# Patient Record
Sex: Female | Born: 1980 | Hispanic: Yes | Marital: Married | Smoking: Never smoker
Health system: Southern US, Community
[De-identification: ages and names within clinical notes are randomized; demographics above are authoritative.]

## PROBLEM LIST (undated history)

## (undated) DIAGNOSIS — G44229 Chronic tension-type headache, not intractable: Secondary | ICD-10-CM

## (undated) DIAGNOSIS — K219 Gastro-esophageal reflux disease without esophagitis: Secondary | ICD-10-CM

## (undated) DIAGNOSIS — G43909 Migraine, unspecified, not intractable, without status migrainosus: Secondary | ICD-10-CM

## (undated) DIAGNOSIS — Z789 Other specified health status: Secondary | ICD-10-CM

## (undated) DIAGNOSIS — D649 Anemia, unspecified: Secondary | ICD-10-CM

## (undated) DIAGNOSIS — N83209 Unspecified ovarian cyst, unspecified side: Secondary | ICD-10-CM

## (undated) DIAGNOSIS — K8020 Calculus of gallbladder without cholecystitis without obstruction: Secondary | ICD-10-CM

## (undated) DIAGNOSIS — M7918 Myalgia, other site: Secondary | ICD-10-CM

## (undated) DIAGNOSIS — M47816 Spondylosis without myelopathy or radiculopathy, lumbar region: Secondary | ICD-10-CM

## (undated) HISTORY — DX: Migraine, unspecified, not intractable, without status migrainosus: G43.909

## (undated) HISTORY — DX: Unspecified ovarian cyst, unspecified side: N83.209

## (undated) HISTORY — PX: NO PAST SURGERIES: SHX2092

---

## 2012-07-31 ENCOUNTER — Emergency Department: Payer: Self-pay | Admitting: Internal Medicine

## 2014-02-10 ENCOUNTER — Ambulatory Visit: Payer: Self-pay | Admitting: Family Medicine

## 2014-09-06 ENCOUNTER — Ambulatory Visit: Payer: Self-pay

## 2014-10-11 NOTE — H&P (Signed)
GYN PRE-OP Visit  CC: dermoid cyst management  Subjective:    Erin Ramos is a 34 y.o. female who presents for surgical planning for dermoid cyst. TVUS done 09/2014 confirmed findings of dermoid cyst, although fibroid was not well seen on image.  Hx dermoid cyst: - diagnosed in [place] in 3.2016  - Patient was having AUB two months ago, daily bleeding - now resolved on ortho-tricyclin; This occurred in [place]. She was evaluated at a hospital there with a CTABP and TVUS. The TVUS showed a small fibroid and a 1.3cm hyperechoic mass c/w dermoid. Also a 70mm IM fibroid. CA125 done 71.   Also w/ pelvic pain during intercourse. Suprapubic pain dysparunia, patient is nervous about this, 5/10, stops after conclusion of sex.  No abnl discharge, no changes in bowel or bladder habits.    Gynecologic History No LMP recorded. 08/18/14 Contraception: OCP (estrogen/progesterone) Sexually active: yes Desires STD screening: no  Last Pap: last year. Results were: normal - At [facility]   Obstetric History                      OB History  Gravida Para Term Preterm AB SAB TAB Ectopic Multiple Living  2 2        2     # Outcome Date GA Lbr Len/2nd Weight Sex Delivery Anes PTL Lv  2 Para           1 Para              NSVD, 1999, 2000  Past Medical History:  has no past medical history on file. Problem List: has Dermoid cyst of right ovary on her problem list. Past Surgical History:  has no past surgical history on file. Family History: family history is not on file. Social History:  reports that she has never smoked. She has never used smokeless tobacco. She reports that she does not drink alcohol or use illicit drugs. +seatbelt, no DV Current Medications: has a current medication list which includes the following prescription(s): fluticasone and norgestimate-ethinyl estradiol. Prior to encounter Medications:        Current Outpatient Prescriptions on  File Prior to Visit  Medication Sig Dispense Refill  . fluticasone (FLONASE) 50 mcg/actuation nasal spray Place 1 spray into both nostrils once daily as needed.    . NORGESTIMATE-ETHINYL ESTRADIOL (ORTHO TRI-CYCLEN LO, 28, ORAL) Take 1 tablet by mouth once daily.     No current facility-administered medications on file prior to visit.    Allergies: is allergic to shellfish derived.  Review of Systems 14 systems reviewed pertinent positives and negatives as noted in the HPI and below.   Objective:        Visit Vitals  . BP 114/71  . Pulse 58  . Wt 77.7 kg (171 lb 3.2 oz)  . BMI 29.39 kg/m2    General appearance: alert, appears stated age and cooperative    Head: Normocephalic, without obvious abnormality, atraumatic Eyes: conjunctivae/corneas clear. PERRL, EOM's intact. Fundi benign. Sclera anicteric. Nose: no discharge Throat: lips, mucosa, and tongue normal; teeth and gums normal Neck: no adenopathy, supple, symmetrical, trachea midline and thyroid not enlarged, symmetric, no tenderness/mass/nodules Back: range of motion normal Lungs: clear to auscultation bilaterally Heart: regular rate and rhythm, S1, S2 normal, no murmur, click, rub or gallop Abdomen: soft, non-tender; bowel sounds normal; no masses, no organomegaly Extremities: extremities normal, atraumatic, no cyanosis or edema Pulses: 2+ and symmetric Skin: Skin color, texture, turgor normal. No  rashes or lesions Lymph nodes: Cervical, supraclavicular, and axillary nodes normal.  Pelvic: cervix normal in appearance, external genitalia normal, no cervical motion tenderness, uterus normal size, shape, and consistency and vagina normal without discharge; prominent anterior fibroid palpated superior to cervix on bimanual with TTP. Fullness in left adnexa with mild TTP.   TVUS 09/2014 Ut wnl No definite fibroid seen Lt ov wnl Rt simple ov cyst=1.31 cm Hyperechoic mass seen Post to rt ov= 1.36 x 1.11 x 1.05  cm  Imaging (reviewed by me): 07/05/14 - TVUS in [place]; posterior IM fibroid 30mm; EMS 105mm, hyperechoic avascular cyst in right ovary, 39mm c/w dermoid CTABP - normal   3/39/2015 - TVUS Uterus 8.8x3.1x4.8cm, superior uterine fibroid measuring 0.6x0.5x0.7cm EMS 2.58mm, Right ovary w/ small cyst 1.8x1.0x1.5cm, likely physiologic  Assessment:  34yo G2P2 w/ 1.3 cm hyperechoic mass on right ovary likely c/w dermoid cyst, also w/ intramural fibroid/mass causing dysparunia. We discussed surgical management with Lapx R ovarian cystectomy and possible myomectomy if fibroid visualized at time of surgery,   Plan:    1. Lapx ovarian cystectomy for 1.17mm Right hyperechoic ovarian cyst - R/B/A of surgery discussed  - The patient and I discussed the technical aspects of the procedure including the potential for risks and complications. These include but are not limited to the risk of infection requiring post-operative antibiotics or further procedures. We talked about the risk of injury to adjacent organs including bladder, bowel, ureter, blood vessels or nerves. We talked about the need to convert to an open incision. We talked about the possible need for blood transfusion. We talked aboutpostop complications such asthromboembolic or cardiopulmonary complications.We discussed possible transient menopausal symptoms after cystectomy. All of her questions were answered. The appropriate consents were signed. She is scheduled to undergo this procedure in the near future. - CA125 slightly elevated at 69, but patient premenopausal and this is consistent with diagnosis of dermoid cyst  2. Dysparunia and fibroid uterus - discussed possibility of myomectomy at same time as cystectomy, however the fibroid is not well visualized on TVUS. We discussed the r/b/a of myomectomy including increased bleeding and injury to uterus if the fibroid is not well visualized at time of lapx. We discussed removal if it is  subserosal and can be easily accessed.   3. HCM - uptodate with pap per patient .   Joylene Igo, MD

## 2014-10-16 ENCOUNTER — Encounter
Admission: RE | Admit: 2014-10-16 | Discharge: 2014-10-16 | Disposition: A | Discharge status: Home / Self Care | Payer: BC Managed Care – PPO | Source: Ambulatory Visit | Attending: Obstetrics and Gynecology | Admitting: Obstetrics and Gynecology

## 2014-10-16 DIAGNOSIS — Z01818 Encounter for other preprocedural examination: Secondary | ICD-10-CM | POA: Insufficient documentation

## 2014-10-16 DIAGNOSIS — D369 Benign neoplasm, unspecified site: Secondary | ICD-10-CM | POA: Insufficient documentation

## 2014-10-16 HISTORY — DX: Other specified health status: Z78.9

## 2014-10-16 LAB — CBC
HCT: 39.2 % (ref 35.0–47.0)
Hemoglobin: 13.4 g/dL (ref 12.0–16.0)
MCH: 31.3 pg (ref 26.0–34.0)
MCHC: 34.3 g/dL (ref 32.0–36.0)
MCV: 91.5 fL (ref 80.0–100.0)
PLATELETS: 241 10*3/uL (ref 150–440)
RBC: 4.28 MIL/uL (ref 3.80–5.20)
RDW: 13.1 % (ref 11.5–14.5)
WBC: 6.4 10*3/uL (ref 3.6–11.0)

## 2014-10-16 LAB — TYPE AND SCREEN
ABO/RH(D): B POS
Antibody Screen: NEGATIVE

## 2014-10-16 LAB — ABO/RH: ABO/RH(D): B POS

## 2014-10-17 LAB — BETA HCG QUANT (REF LAB)

## 2014-10-27 ENCOUNTER — Ambulatory Visit
Admission: RE | Admit: 2014-10-27 | Discharge: 2014-10-27 | Disposition: A | Discharge status: Home / Self Care | Payer: BC Managed Care – PPO | Source: Ambulatory Visit | Attending: Obstetrics and Gynecology | Admitting: Obstetrics and Gynecology

## 2014-10-27 ENCOUNTER — Encounter
Admission: RE | Disposition: A | Discharge status: Home / Self Care | Payer: Self-pay | Source: Ambulatory Visit | Attending: Obstetrics and Gynecology

## 2014-10-27 ENCOUNTER — Encounter: Payer: Self-pay | Admitting: *Deleted

## 2014-10-27 ENCOUNTER — Ambulatory Visit: Payer: BC Managed Care – PPO | Admitting: Anesthesiology

## 2014-10-27 DIAGNOSIS — R102 Pelvic and perineal pain: Secondary | ICD-10-CM | POA: Diagnosis not present

## 2014-10-27 DIAGNOSIS — N838 Other noninflammatory disorders of ovary, fallopian tube and broad ligament: Secondary | ICD-10-CM | POA: Diagnosis not present

## 2014-10-27 DIAGNOSIS — N938 Other specified abnormal uterine and vaginal bleeding: Secondary | ICD-10-CM | POA: Insufficient documentation

## 2014-10-27 DIAGNOSIS — N941 Unspecified dyspareunia: Secondary | ICD-10-CM | POA: Diagnosis not present

## 2014-10-27 DIAGNOSIS — N803 Endometriosis of pelvic peritoneum: Secondary | ICD-10-CM | POA: Diagnosis not present

## 2014-10-27 DIAGNOSIS — N809 Endometriosis, unspecified: Secondary | ICD-10-CM | POA: Diagnosis present

## 2014-10-27 DIAGNOSIS — Z91013 Allergy to seafood: Secondary | ICD-10-CM | POA: Insufficient documentation

## 2014-10-27 HISTORY — PX: LAPAROSCOPIC OVARIAN CYSTECTOMY: SHX6248

## 2014-10-27 LAB — POCT PREGNANCY, URINE: PREG TEST UR: NEGATIVE

## 2014-10-27 SURGERY — EXCISION, CYST, OVARY, LAPAROSCOPIC
Anesthesia: General | Laterality: Right

## 2014-10-27 MED ORDER — FENTANYL CITRATE (PF) 100 MCG/2ML IJ SOLN
INTRAMUSCULAR | Status: AC
  Administered 2014-10-27: 25 ug via INTRAVENOUS
  Filled 2014-10-27: qty 2

## 2014-10-27 MED ORDER — ONDANSETRON HCL 4 MG/2ML IJ SOLN: 4.0000 mg | Freq: Four times a day (QID) | INTRAMUSCULAR | Status: DC | PRN

## 2014-10-27 MED ORDER — BUPIVACAINE HCL 0.5 % IJ SOLN
INTRAMUSCULAR | Status: DC | PRN
  Administered 2014-10-27: 21 mL

## 2014-10-27 MED ORDER — KETOROLAC TROMETHAMINE 30 MG/ML IJ SOLN
INTRAMUSCULAR | Status: DC | PRN
  Administered 2014-10-27: 30 mg via INTRAVENOUS

## 2014-10-27 MED ORDER — OXYCODONE-ACETAMINOPHEN 5-325 MG PO TABS
ORAL_TABLET | ORAL | Status: AC
  Filled 2014-10-27: qty 2

## 2014-10-27 MED ORDER — ONDANSETRON HCL 4 MG/2ML IJ SOLN: 4.0000 mg | Freq: Once | INTRAMUSCULAR | Status: DC | PRN

## 2014-10-27 MED ORDER — KETOROLAC TROMETHAMINE 15 MG/ML IJ SOLN
15.0000 mg | Freq: Four times a day (QID) | INTRAMUSCULAR | Status: DC | PRN
Start: 2014-10-27 — End: 2014-10-27

## 2014-10-27 MED ORDER — ONDANSETRON 4 MG PO TBDP: 4.0000 mg | ORAL_TABLET | Freq: Four times a day (QID) | ORAL | Status: DC | PRN

## 2014-10-27 MED ORDER — LACTATED RINGERS IV SOLN: INTRAVENOUS | Status: DC

## 2014-10-27 MED ORDER — OXYCODONE-ACETAMINOPHEN 5-325 MG PO TABS
2.0000 | ORAL_TABLET | Freq: Once | ORAL | Status: AC
  Administered 2014-10-27: 2 via ORAL

## 2014-10-27 MED ORDER — GLYCOPYRROLATE 0.2 MG/ML IJ SOLN
INTRAMUSCULAR | Status: DC | PRN
  Administered 2014-10-27: 0.6 mg via INTRAVENOUS

## 2014-10-27 MED ORDER — NEOSTIGMINE METHYLSULFATE 10 MG/10ML IV SOLN
INTRAVENOUS | Status: DC | PRN
  Administered 2014-10-27: 5 mg via INTRAVENOUS

## 2014-10-27 MED ORDER — PHENYLEPHRINE HCL 10 MG/ML IJ SOLN
INTRAMUSCULAR | Status: DC | PRN
  Administered 2014-10-27: 100 ug via INTRAVENOUS

## 2014-10-27 MED ORDER — BUPIVACAINE HCL (PF) 0.5 % IJ SOLN
INTRAMUSCULAR | Status: AC
  Filled 2014-10-27: qty 30

## 2014-10-27 MED ORDER — FENTANYL CITRATE (PF) 100 MCG/2ML IJ SOLN
25.0000 ug | INTRAMUSCULAR | Status: AC | PRN
  Administered 2014-10-27 (×6): 25 ug via INTRAVENOUS

## 2014-10-27 MED ORDER — ACETAMINOPHEN 650 MG RE SUPP: 650.0000 mg | Freq: Four times a day (QID) | RECTAL | Status: DC | PRN

## 2014-10-27 MED ORDER — PROPOFOL 10 MG/ML IV BOLUS
INTRAVENOUS | Status: DC | PRN
  Administered 2014-10-27: 150 mg via INTRAVENOUS

## 2014-10-27 MED ORDER — DIPHENHYDRAMINE HCL 12.5 MG/5ML PO ELIX: 12.5000 mg | ORAL_SOLUTION | Freq: Four times a day (QID) | ORAL | Status: DC | PRN

## 2014-10-27 MED ORDER — FENTANYL CITRATE (PF) 100 MCG/2ML IJ SOLN
INTRAMUSCULAR | Status: DC | PRN
  Administered 2014-10-27 (×2): 100 ug via INTRAVENOUS

## 2014-10-27 MED ORDER — DEXAMETHASONE SODIUM PHOSPHATE 4 MG/ML IJ SOLN
INTRAMUSCULAR | Status: DC | PRN
  Administered 2014-10-27: 10 mg via INTRAVENOUS

## 2014-10-27 MED ORDER — LIDOCAINE HCL (CARDIAC) 20 MG/ML IV SOLN
INTRAVENOUS | Status: DC | PRN
  Administered 2014-10-27: 80 mg via INTRAVENOUS

## 2014-10-27 MED ORDER — ACETAMINOPHEN 325 MG PO TABS: 650.0000 mg | ORAL_TABLET | Freq: Four times a day (QID) | ORAL | Status: DC | PRN

## 2014-10-27 MED ORDER — DIPHENHYDRAMINE HCL 50 MG/ML IJ SOLN: 12.5000 mg | Freq: Four times a day (QID) | INTRAMUSCULAR | Status: DC | PRN

## 2014-10-27 MED ORDER — LACTATED RINGERS IV SOLN
INTRAVENOUS | Status: DC
  Administered 2014-10-27 (×3): via INTRAVENOUS

## 2014-10-27 MED ORDER — FAMOTIDINE 20 MG PO TABS
20.0000 mg | ORAL_TABLET | Freq: Once | ORAL | Status: AC
  Administered 2014-10-27: 20 mg via ORAL

## 2014-10-27 MED ORDER — ONDANSETRON HCL 4 MG/2ML IJ SOLN
INTRAMUSCULAR | Status: DC | PRN
  Administered 2014-10-27: 4 mg via INTRAVENOUS

## 2014-10-27 MED ORDER — METHYLENE BLUE 1 % INJ SOLN
INTRAMUSCULAR | Status: AC
  Filled 2014-10-27: qty 10

## 2014-10-27 MED ORDER — OXYCODONE HCL 5 MG PO TABS: 5.0000 mg | ORAL_TABLET | ORAL | Status: DC | PRN

## 2014-10-27 MED ORDER — KETOROLAC TROMETHAMINE 15 MG/ML IJ SOLN: 15.0000 mg | Freq: Four times a day (QID) | INTRAMUSCULAR | Status: DC

## 2014-10-27 MED ORDER — OXYCODONE-ACETAMINOPHEN 5-325 MG PO TABS: 2.0000 | ORAL_TABLET | ORAL | Status: DC | PRN

## 2014-10-27 MED ORDER — MIDAZOLAM HCL 2 MG/2ML IJ SOLN
INTRAMUSCULAR | Status: DC | PRN
  Administered 2014-10-27: 2 mg via INTRAVENOUS

## 2014-10-27 MED ORDER — ROCURONIUM BROMIDE 100 MG/10ML IV SOLN
INTRAVENOUS | Status: DC | PRN
  Administered 2014-10-27: 30 mg via INTRAVENOUS
  Administered 2014-10-27: 10 mg via INTRAVENOUS

## 2014-10-27 SURGICAL SUPPLY — 36 items
BAG URO DRAIN 2000ML W/SPOUT (MISCELLANEOUS) ×4 IMPLANT
BLADE SURG SZ11 CARB STEEL (BLADE) ×4 IMPLANT
CATH FOLEY 2WAY  5CC 16FR (CATHETERS) ×2
CATH ROBINSON RED A/P 16FR (CATHETERS) ×4 IMPLANT
CATH URTH 16FR FL 2W BLN LF (CATHETERS) ×2 IMPLANT
CHLORAPREP W/TINT 26ML (MISCELLANEOUS) ×4 IMPLANT
CLOSURE WOUND 1/4X4 (GAUZE/BANDAGES/DRESSINGS) ×2
DRSG TEGADERM 2-3/8X2-3/4 SM (GAUZE/BANDAGES/DRESSINGS) ×8 IMPLANT
ENDOPOUCH RETRIEVER 10 (MISCELLANEOUS) IMPLANT
GAUZE SPONGE NON-WVN 2X2 STRL (MISCELLANEOUS) ×2 IMPLANT
GLOVE BIO SURGEON STRL SZ 6.5 (GLOVE) ×12 IMPLANT
GLOVE BIO SURGEONS STRL SZ 6.5 (GLOVE) ×4
GLOVE INDICATOR 7.0 STRL GRN (GLOVE) ×8 IMPLANT
GOWN STRL REUS W/ TWL LRG LVL3 (GOWN DISPOSABLE) ×4 IMPLANT
GOWN STRL REUS W/TWL LRG LVL3 (GOWN DISPOSABLE) ×4
IRRIGATION STRYKERFLOW (MISCELLANEOUS) ×2 IMPLANT
IRRIGATOR STRYKERFLOW (MISCELLANEOUS) ×4
KIT RM TURNOVER CYSTO AR (KITS) ×4 IMPLANT
LABEL OR SOLS (LABEL) ×4 IMPLANT
LIGASURE BLUNT 5MM 37CM (INSTRUMENTS) ×4 IMPLANT
NS IRRIG 500ML POUR BTL (IV SOLUTION) ×4 IMPLANT
PACK GYN LAPAROSCOPIC (MISCELLANEOUS) ×4 IMPLANT
PAD OB MATERNITY 4.3X12.25 (PERSONAL CARE ITEMS) ×4 IMPLANT
PAD PREP 24X41 OB/GYN DISP (PERSONAL CARE ITEMS) ×4 IMPLANT
SHEARS HARMONIC ACE PLUS 36CM (ENDOMECHANICALS) IMPLANT
SLEEVE ENDOPATH XCEL 5M (ENDOMECHANICALS) ×4 IMPLANT
SPONGE VERSALON 2X2 STRL (MISCELLANEOUS) ×2
STRIP CLOSURE SKIN 1/4X4 (GAUZE/BANDAGES/DRESSINGS) ×6 IMPLANT
SUT VIC AB 2-0 UR6 27 (SUTURE) IMPLANT
SUT VIC AB 4-0 SH 27 (SUTURE)
SUT VIC AB 4-0 SH 27XANBCTRL (SUTURE) IMPLANT
SWABSTK COMLB BENZOIN TINCTURE (MISCELLANEOUS) ×4 IMPLANT
TROCAR ENDO BLADELESS 11MM (ENDOMECHANICALS) IMPLANT
TROCAR XCEL NON-BLD 5MMX100MML (ENDOMECHANICALS) ×4 IMPLANT
TROCAR XCEL UNIV SLVE 11M 100M (ENDOMECHANICALS) ×4 IMPLANT
TUBING INSUFFLATOR HI FLOW (MISCELLANEOUS) ×4 IMPLANT

## 2014-10-27 NOTE — Anesthesia Postprocedure Evaluation (Signed)
  Anesthesia Post-op Note  Patient: Erin Ramos  Procedure(s) Performed: Procedure(s): LAPAROSCOPIC OVARIAN CYSTECTOMY, peritoneal biopsy (Right)  Anesthesia type:General  Patient location: PACU  Post pain: Pain level controlled  Post assessment: Post-op Vital signs reviewed, Patient's Cardiovascular Status Stable, Respiratory Function Stable, Patent Airway and No signs of Nausea or vomiting  Post vital signs: Reviewed and stable  Last Vitals:  Filed Vitals:   10/27/14 1200  BP: 128/64  Pulse:   Temp:   Resp: 18    Level of consciousness: awake, alert  and patient cooperative  Complications: No apparent anesthesia complications

## 2014-10-27 NOTE — Anesthesia Preprocedure Evaluation (Addendum)
Anesthesia Evaluation  Patient identified by MRN, date of birth, ID band Patient awake    Reviewed: Allergy & Precautions, NPO status , Patient's Chart, lab work & pertinent test results  Airway Mallampati: II  TM Distance: >3 FB Neck ROM: Full    Dental no notable dental hx.    Pulmonary neg pulmonary ROS,    Pulmonary exam normal breath sounds clear to auscultation       Cardiovascular negative cardio ROS Normal cardiovascular exam     Neuro/Psych negative neurological ROS  negative psych ROS   GI/Hepatic negative GI ROS, Neg liver ROS,   Endo/Other  negative endocrine ROS  Renal/GU negative Renal ROS     Musculoskeletal negative musculoskeletal ROS (+)   Abdominal Normal abdominal exam  (+)   Peds negative pediatric ROS (+)  Hematology negative hematology ROS (+)   Anesthesia Other Findings   Reproductive/Obstetrics negative OB ROS                            Anesthesia Physical Anesthesia Plan  ASA: I  Anesthesia Plan: General   Post-op Pain Management:    Induction: Intravenous  Airway Management Planned: Oral ETT  Additional Equipment:   Intra-op Plan:   Post-operative Plan: Extubation in OR  Informed Consent: I have reviewed the patients History and Physical, chart, labs and discussed the procedure including the risks, benefits and alternatives for the proposed anesthesia with the patient or authorized representative who has indicated his/her understanding and acceptance.   Dental advisory given  Plan Discussed with: CRNA and Surgeon  Anesthesia Plan Comments:         Anesthesia Quick Evaluation

## 2014-10-27 NOTE — Anesthesia Procedure Notes (Signed)
Procedure Name: Intubation Date/Time: 10/27/2014 7:54 AM Performed by: Leander Rams Pre-anesthesia Checklist: Patient identified, Emergency Drugs available, Suction available, Patient being monitored and Timeout performed Patient Re-evaluated:Patient Re-evaluated prior to inductionOxygen Delivery Method: Circle system utilized Preoxygenation: Pre-oxygenation with 100% oxygen Intubation Type: IV induction Ventilation: Mask ventilation without difficulty Laryngoscope Size: 3 Grade View: Grade I Tube type: Oral Tube size: 7.0 mm Number of attempts: 1 Airway Equipment and Method: Stylet Placement Confirmation: ETT inserted through vocal cords under direct vision Secured at: 21 cm Tube secured with: Tape Dental Injury: Teeth and Oropharynx as per pre-operative assessment

## 2014-10-27 NOTE — Interval H&P Note (Signed)
History and Physical Interval Note:  10/27/2014 7:15 AM  Erin Ramos  has presented today for surgery, with the diagnosis of DERMOID CYST,FIBROID  The various methods of treatment have been discussed with the patient and family. After consideration of risks, benefits and other options for treatment, the patient has consented to  Procedure(s): LAPAROSCOPIC OVARIAN CYSTECTOMY (Right) MYOMECTOMY (N/A) as a surgical intervention .  The patient's history has been reviewed, patient examined, no change in status, stable for surgery.  I have reviewed the patient's chart and labs.  Questions were answered to the patient's satisfaction.     [place]

## 2014-10-27 NOTE — Discharge Instructions (Signed)
Diagnostic Laparoscopy A diagnostic laparoscopy is a procedure to diagnose diseases in the abdomen. During the procedure, a thin, lighted, pencil-sized instrument called a laparoscope is inserted into the abdomen through an incision. The laparoscope allows your health care provider to look at the organs inside your body. LET [facility] CARE PROVIDER KNOW ABOUT:  Any allergies you have.  All medicines you are taking, including vitamins, herbs, eye drops, creams, and over-the-counter medicines.  Previous problems you or members of your family have had with the use of anesthetics.  Any blood disorders you have.  Previous surgeries you have had.  Medical conditions you have. RISKS AND COMPLICATIONS  Generally, this is a safe procedure. However, problems can occur, which may include:  Infection.  Bleeding.  Damage to other organs.  Allergic reaction to the anesthetics used during the procedure. AFTER THE PROCEDURE  Your blood pressure, heart rate, breathing rate, and blood oxygen level will be monitored often until the medicines you were given have worn off.   This information is not intended to replace advice given to you by your health care provider. Make sure you discuss any questions you have with your health care provider.   Document Released: 04/07/2000 Document Revised: 09/20/2014 Document Reviewed: 08/12/2013 Elsevier Interactive Patient Education 2016 [place] Anesthesia, Adult, Care After Refer to this sheet in the next few weeks. These instructions provide you with information on caring for yourself after your procedure. Your health care provider may also give you more specific instructions. Your treatment has been planned according to current medical practices, but problems sometimes occur. Call your health care provider if you have any problems or questions after your procedure. WHAT TO EXPECT AFTER THE PROCEDURE After the procedure, it is typical to  experience:  Sleepiness.  Nausea and vomiting. HOME CARE INSTRUCTIONS  For the first 24 hours after general anesthesia:  Have a responsible person with you.  Do not drive a car. If you are alone, do not take public transportation.  Do not drink alcohol.  Do not take medicine that has not been prescribed by your health care provider.  Do not sign important papers or make important decisions.  You may resume a normal diet and activities as directed by your health care provider.  Change bandages (dressings) as directed.  If you have questions or problems that seem related to general anesthesia, call the hospital and ask for the anesthetist or anesthesiologist on call. SEEK MEDICAL CARE IF:  You have nausea and vomiting that continue the day after anesthesia.  You develop a rash. SEEK IMMEDIATE MEDICAL CARE IF:   You have difficulty breathing.  You have chest pain.  You have any allergic problems.   This information is not intended to replace advice given to you by your health care provider. Make sure you discuss any questions you have with your health care provider.   Document Released: 04/07/2000 Document Revised: 01/20/2014 Document Reviewed: 04/30/2011 Elsevier Interactive Patient Education [employer].

## 2014-10-27 NOTE — Transfer of Care (Signed)
Immediate Anesthesia Transfer of Care Note  Patient: Erin Ramos  Procedure(s) Performed: Procedure(s): LAPAROSCOPIC OVARIAN CYSTECTOMY, peritoneal biopsy (Right)  Patient Location: PACU  Anesthesia Type:General  Level of Consciousness: awake  Airway & Oxygen Therapy: Patient Spontanous Breathing  Post-op Assessment: Report given to RN  Post vital signs: stable  Last Vitals:  Filed Vitals:   10/27/14 1003  BP: 115/77  Pulse: 75  Temp: 37.2 C  Resp: 15    Complications: No apparent anesthesia complications

## 2014-10-27 NOTE — Op Note (Signed)
Laparoscopic Ovarian Cystectomy, peritoneal biopsy Procedure Note  Indications: 1cm Right ovarian cyst, dysparunia  Pre-operative Diagnosis: Dermoid cyst, small fibroid  Post-operative Diagnosis: Endometriosis, paratubal cyst  Surgeon: Lorette Ang , MD  Assistants: Benjaman Kindler, MD  Anesthesia: General endotracheal anesthesia  Procedure Details  The patient was seen in the Holding Room. The risks, benefits, complications, treatment options, and expected outcomes were discussed with the patient. The possibilities of reaction to medication, pulmonary aspiration, perforation of viscus, bleeding, recurrent infection, the need for additional procedures, failure to diagnose a condition, and creating a complication requiring transfusion or operation were discussed with the patient. The patient concurred with the proposed plan, giving informed consent. The patient was taken to the Operating Room, identified as Erin Ramos and the procedure verified as Laparoscopic right ovarian cystectomy, possible myomectomy. A Time Out was held and the above information confirmed.  After induction of general anesthesia, the patient was placed in modified dorsal lithotomy position where she was prepped, draped, and catheterized in the normal, sterile fashion. A bivalved speculum was then placed in the patient's vagina, and the anterior lip of cervix grasped with the single-tooth tenaculum.  The ZUMI uterine manipulator was then advanced into the uterus.  The speculum was removed from the vagina.  1cc of 0.5% marcaine was injected at the inferior aspect of the umbilicus. A 5 mm umbilical incision was then performed. Under direct visualization, the optiview 14mm trochar was inserted into the abdomen. Opening pressure 22mmHG. Pneumoperitoneum was established. A second 19mm incision was made and a 58mm trochar was inserted 2cm superior and medial to the right ASIS. A 73mm incision was made 2cm superior and medial to  the left ASIS and a 63mm trochar was inserted. The findings were noted as described below: 3 10mm chocolate "burn" like implants on the posterior cul de sac, the left uterosacral ligament, and the right pelvic sidewall; normal uterus without evidence of subserosal fibroids, normal ovaries, and a 1cm right paratubal cyst.  A survey of the abdomen did not reveal any obvious findings.   Using a biopsy forceps, the three presumed endometrial implants were removed and sent to pathology. Bipolar shears were used to remove the paratubal cyst from the right fallopian tube. This was also sent to pathology. The right ovary did not have an obvious cyst. After careful dissection of the capsule, no sebacous material was encountered. We attempted multiple passes with the needle forceps into the ovary, but no cyst was encountered. Hemostasis was achieved with bipolar cautery. 0.5% marcaine was drizzled over the right ovary. All sites were hemostatic. Suction irrigation was used in the pelvis. The 63mm trochar was removed and the fascia closed with the cone and 0-vicryl. After all intra-abdominal carbon dioxide was expressed, all instruments were removed. The skin was closed with 4-0 vicryl. The lower quadrant incisions were covered with benzoine and steristrips. A tegrederm dressing with telfa was placed on top of all three incisions.   Instrument, sponge, and needle counts were correct prior to abdominal closure and at the conclusion of the case. IV toradol was given.   Findings: The anterior cul-de-sac and round ligaments: normal The uterus normal without evidence of fibroids The adnexa both appeared normal; Tube appeared normal Cul-de-sac with evidence of 3 "powder burn" lesions - left uterosacral, right pelvic wall 1cm right paraovarian cyst  Estimated Blood Loss:  less than 50 mL         Drains: none  Total IV Fluids: 1281mL         Specimens: peritoneal biopsies, right paraovarian cyst            Complications:  None; patient tolerated the procedure well.         Disposition: PACU - hemodynamically stable.         Condition: stable  Lorette Ang, MD

## 2014-10-30 LAB — SURGICAL PATHOLOGY

## 2014-11-02 ENCOUNTER — Ambulatory Visit
Admission: RE | Admit: 2014-11-02 | Discharge: 2014-11-02 | Disposition: A | Discharge status: Home / Self Care | Payer: BC Managed Care – PPO | Source: Ambulatory Visit | Attending: Obstetrics and Gynecology | Admitting: Obstetrics and Gynecology

## 2014-11-02 ENCOUNTER — Other Ambulatory Visit: Payer: Self-pay | Admitting: Obstetrics and Gynecology

## 2014-11-02 DIAGNOSIS — R1032 Left lower quadrant pain: Secondary | ICD-10-CM

## 2014-11-02 DIAGNOSIS — R109 Unspecified abdominal pain: Secondary | ICD-10-CM | POA: Diagnosis present

## 2014-11-02 DIAGNOSIS — L7682 Other postprocedural complications of skin and subcutaneous tissue: Secondary | ICD-10-CM

## 2014-11-02 DIAGNOSIS — R208 Other disturbances of skin sensation: Secondary | ICD-10-CM | POA: Diagnosis not present

## 2014-11-19 ENCOUNTER — Emergency Department
Admission: EM | Admit: 2014-11-19 | Discharge: 2014-11-19 | Disposition: A | Discharge status: Home / Self Care | Payer: BC Managed Care – PPO

## 2015-08-15 ENCOUNTER — Other Ambulatory Visit: Payer: Self-pay | Admitting: Family Medicine

## 2015-08-15 DIAGNOSIS — N809 Endometriosis, unspecified: Secondary | ICD-10-CM

## 2015-10-08 ENCOUNTER — Ambulatory Visit: Payer: BC Managed Care – PPO

## 2016-03-17 ENCOUNTER — Ambulatory Visit: Payer: BC Managed Care – PPO

## 2017-08-06 ENCOUNTER — Other Ambulatory Visit: Payer: Self-pay

## 2017-08-06 ENCOUNTER — Ambulatory Visit: Payer: BC Managed Care – PPO | Admitting: Gastroenterology

## 2017-08-06 VITALS — BP 109/75 | HR 74 | Ht 64.0 in | Wt 183.8 lb

## 2017-08-06 DIAGNOSIS — A048 Other specified bacterial intestinal infections: Secondary | ICD-10-CM | POA: Diagnosis not present

## 2017-08-06 DIAGNOSIS — D270 Benign neoplasm of right ovary: Secondary | ICD-10-CM | POA: Insufficient documentation

## 2017-08-06 MED ORDER — TETRACYCLINE HCL 500 MG PO CAPS: 500.0000 mg | ORAL_CAPSULE | Freq: Four times a day (QID) | ORAL | 0 refills | Status: AC

## 2017-08-06 MED ORDER — BISMUTH SUBSALICYLATE 262 MG PO CHEW: 524.0000 mg | CHEWABLE_TABLET | Freq: Four times a day (QID) | ORAL | 0 refills | Status: AC

## 2017-08-06 MED ORDER — METRONIDAZOLE 500 MG PO TABS: 500.0000 mg | ORAL_TABLET | Freq: Four times a day (QID) | ORAL | 0 refills | Status: AC

## 2017-08-06 MED ORDER — OMEPRAZOLE 40 MG PO CPDR: 40.0000 mg | DELAYED_RELEASE_CAPSULE | Freq: Two times a day (BID) | ORAL | 0 refills | Status: AC

## 2017-08-06 NOTE — Progress Notes (Signed)
Jonathon Bellows MD, MRCP(U.K) [address]  [place]  [place], [place] [address]  Main: [phone]  Fax: [phone]   Gastroenterology Consultation  Referring Provider:     [name], MD Primary Care Physician:  [name], MD Primary Gastroenterologist:  Dr. Jonathon Bellows  Reason for Consultation:     H pylori         HPI:   Erin Ramos is a 37 y.o. y/o female referred for consultation & management  by Dr. [name], MD.    She has been referred by Dr Posey Pronto for H pylori stool test that was positive in 06/2017 .   She says it was checked for abdominal pain. She is accompanied by hyer husband.    Abdominal pain: Onset: says she has had abdominal pain for a year and was treated for H pylori last year.  Site :epigastric , all the time  Radiation: localized  Severity :carafate helps otherwise 8/10  Nature of pain: squeezing  Aggravating factors: eating  Relieving factors :carafate  Weight loss: no  NSAID use: no  PPI use :no  Gall bladder surgery: no - was supposed to have gall bladder surgery  Frequency of bowel movements: every two days not hard  Change in bowel movements: normal  Relief with bowel movements: no  Gas/Bloating/Abdominal distension: gas   She has been treated 3 times so far.  Orginally Based on her phone reports which I viewed on her phone- appears she has been treated with clarithromycin and amoxicillin on all occasions - I could not see any other antibiotics.   She had an EGD in [place] which showed H pylori gastritis.     Past Medical History:  Diagnosis Date  . Medical history non-contributory     Past Surgical History:  Procedure Laterality Date  . LAPAROSCOPIC OVARIAN CYSTECTOMY Right 10/27/2014   Procedure: LAPAROSCOPIC OVARIAN CYSTECTOMY, peritoneal biopsy;  Surgeon: Lorette Ang, MD;  Location: [facility] ORS;  Service: Gynecology;  Laterality: Right;  . NO PAST SURGERIES      Prior to Admission medications   Medication  Sig Start Date End Date Taking? Authorizing Provider  ondansetron (ZOFRAN) 4 MG tablet Take by mouth. 11/10/14  Yes [provider]  amitriptyline (ELAVIL) 10 MG tablet  07/21/17   [provider]  butalbital-acetaminophen-caffeine (FIORICET, ESGIC) [phone] MG tablet  07/21/17   [provider]  cyclobenzaprine (FLEXERIL) 5 MG tablet  07/27/17   [provider]  fexofenadine (ALLEGRA) 180 MG tablet TK 1 T PO QD PRN 07/06/17   [provider]  fluticasone (FLONASE) 50 MCG/ACT nasal spray SHAKE LQ AND U 1 SPR IEN QAM 07/06/17   [provider]  HYDROcodone-acetaminophen (NORCO/VICODIN) 5-325 MG tablet TK 1 T PO BID PRN 05/02/17   [provider]  montelukast (SINGULAIR) 10 MG tablet TK 1 T PO QHS 07/06/17   [provider]  Norgestim-Eth Estrad Triphasic (ORTHO TRI-CYCLEN, 28, PO) Take 1 tablet by mouth daily.    [provider]  omeprazole (PRILOSEC) 40 MG capsule  07/13/17   [provider]  oxyCODONE-acetaminophen (ROXICET) 5-325 MG tablet Take 2 tablets by mouth every 4 (four) hours as needed for severe pain (Can take 1 every 4 hours as needed for moderate pain). 10/27/14   Lorette Ang, MD  sucralfate (CARAFATE) 1 g tablet  07/13/17   [provider]  tiZANidine (ZANAFLEX) 4 MG tablet  06/15/17   [provider]    No family  history on file.   Social History   Tobacco Use  . Smoking status: Never Smoker  . Smokeless tobacco: Never Used  Substance Use Topics  . Alcohol use: No  . Drug use: No    Allergies as of 08/06/2017 - Review Complete 10/27/2014  Allergen Reaction Noted  . Shellfish allergy Anaphylaxis 10/16/2014    Review of Systems:    All systems reviewed and negative except where noted in HPI.   Physical Exam:  BP 109/75   Pulse 74   Ht 5\' 4"  (1.626 m)   Wt 183 lb 12.8 oz (83.4 kg)   BMI 31.55 kg/m  No LMP recorded. Psych:  Alert and cooperative. Normal mood and  affect. General:   Alert,  Well-developed, well-nourished, pleasant and cooperative in NAD Head:  Normocephalic and atraumatic. Eyes:  Sclera clear, no icterus.   Conjunctiva pink. Ears:  Normal auditory acuity. Nose:  No deformity, discharge, or lesions. Mouth:  No deformity or lesions,oropharynx pink & moist. Neck:  Supple; no masses or thyromegaly. Lungs:  Respirations even and unlabored.  Clear throughout to auscultation.   No wheezes, crackles, or rhonchi. No acute distress. Heart:  Regular rate and rhythm; no murmurs, clicks, rubs, or gallops. Abdomen:  Normal bowel sounds.  No bruits.  Soft, non-tender and non-distended without masses, hepatosplenomegaly or hernias noted.  No guarding or rebound tenderness.    Neurologic:  Alert and oriented x3;  grossly normal neurologically. Skin:  Intact without significant lesions or rashes. No jaundice. Lymph Nodes:  No significant cervical adenopathy. Psych:  Alert and cooperative. Normal mood and affect.  Imaging Studies: No results found.  Assessment and Plan:   Erin Ramos is a 37 y.o. y/o female has been referred for H pylori - appears to have been treated atleast 3 times with persistence of the bacteria. I looked at what available records  She had and noticed that she has only been treated with a clarithromycin/Azithromycin /amoxicillin/PPI combination. She is likely resistent to this antibiotics and I will treat her with a different regime of Bismuth, Flagyl, tetracycline , PPI for 14 days . Will check for eradication after   Follow up in 8 weeks  Dr Jonathon Bellows MD,MRCP(U.K)

## 2017-08-07 ENCOUNTER — Other Ambulatory Visit: Payer: Self-pay

## 2017-08-07 ENCOUNTER — Telehealth: Payer: Self-pay | Admitting: Gastroenterology

## 2017-08-07 MED ORDER — FLUCONAZOLE 150 MG PO TABS: 150.0000 mg | ORAL_TABLET | Freq: Every day | ORAL | 0 refills | Status: DC

## 2017-08-07 NOTE — Telephone Encounter (Signed)
PT HUSBAND LEFT VM STATING PT WAS SEEN YESTERDAY AND THEY ARE  MISSING A MEDICINE AT THE PHARMACY HE WOULD LIKE A CALL TO CLERARIFY THISD

## 2017-08-13 ENCOUNTER — Ambulatory Visit: Payer: BC Managed Care – PPO | Admitting: Gastroenterology

## 2017-08-13 ENCOUNTER — Encounter: Payer: Self-pay | Admitting: Gastroenterology

## 2017-08-13 VITALS — BP 120/82 | HR 86 | Resp 17 | Ht 64.0 in | Wt 179.6 lb

## 2017-08-13 DIAGNOSIS — A048 Other specified bacterial intestinal infections: Secondary | ICD-10-CM | POA: Diagnosis not present

## 2017-08-13 MED ORDER — FLUCONAZOLE 150 MG PO TABS: 150.0000 mg | ORAL_TABLET | Freq: Once | ORAL | 0 refills | Status: AC

## 2017-08-13 NOTE — Progress Notes (Signed)
Jonathon Bellows MD, MRCP(U.K) [address]  [place], [place] [address]  Main: [phone]  Fax: [phone]   Primary Care Physician: [name], MD  Primary Gastroenterologist:  Dr. Jonathon Bellows   No chief complaint on file.   HPI: Erin Ramos is a 37 y.o. female   Summary of history :  She is here as a follow up to see me to her initial visit on 08/06/17 . She has a history of 3 rounds of treatment for H pylori , appears by the records provided by her that they have all been therapies based on Clarithromycin , Azithromycin  And amoxicillin. She has had an EGD in [place] which showed H pylori gastritis.   I commenced her on a regime for Bismuth, Flagyl, tetracycline , PPI for 14 days after her last visit on 08/06/17. She is here today to see me as she had some side effects from the medications   Interval history   08/06/2017-  08/13/2017  She says that she devloped soreness in her mouth, nausea, headaches with the antibiotics after 6 days and cannot take it any further.     Current Outpatient Medications  Medication Sig Dispense Refill  . amitriptyline (ELAVIL) 10 MG tablet     . bismuth subsalicylate (PEPTO BISMOL) 262 MG chewable tablet Chew 2 tablets (524 mg total) by mouth QID for 14 days. 112 tablet 0  . butalbital-acetaminophen-caffeine (FIORICET, ESGIC) 50-325-40 MG tablet     . cyclobenzaprine (FLEXERIL) 5 MG tablet     . fexofenadine (ALLEGRA) 180 MG tablet TK 1 T PO QD PRN  5  . fluconazole (DIFLUCAN) 150 MG tablet Take 1 tablet (150 mg total) by mouth daily. Take one tablet one day after completion of antibiotics.  Take second tablet 3 days after the first tablet. 2 tablet 0  . fluticasone (FLONASE) 50 MCG/ACT nasal spray SHAKE LQ AND U 1 SPR IEN QAM  5  . HYDROcodone-acetaminophen (NORCO/VICODIN) 5-325 MG tablet TK 1 T PO BID PRN  0  . metroNIDAZOLE (FLAGYL) 500 MG tablet Take 1 tablet (500 mg total) by mouth 4 (four) times daily for 14 days. 56  tablet 0  . montelukast (SINGULAIR) 10 MG tablet TK 1 T PO QHS  5  . Norgestim-Eth Estrad Triphasic (ORTHO TRI-CYCLEN, 28, PO) Take 1 tablet by mouth daily.    Marland Kitchen omeprazole (PRILOSEC) 40 MG capsule Take 1 capsule (40 mg total) by mouth 2 (two) times daily for 15 days. 30 capsule 0  . ondansetron (ZOFRAN) 4 MG tablet Take by mouth.    . oxyCODONE-acetaminophen (ROXICET) 5-325 MG tablet Take 2 tablets by mouth every 4 (four) hours as needed for severe pain (Can take 1 every 4 hours as needed for moderate pain). 30 tablet 0  . sucralfate (CARAFATE) 1 g tablet     . tetracycline (ACHROMYCIN,SUMYCIN) 500 MG capsule Take 1 capsule (500 mg total) by mouth 4 (four) times daily for 14 days. 56 capsule 0  . tiZANidine (ZANAFLEX) 4 MG tablet      No current facility-administered medications for this visit.     Allergies as of 08/13/2017 - Review Complete 10/27/2014  Allergen Reaction Noted  . Shellfish allergy Anaphylaxis 10/16/2014    ROS:  General: Negative for anorexia, weight loss, fever, chills, fatigue, weakness. ENT: Negative for hoarseness, difficulty swallowing , nasal congestion. CV: Negative for chest pain, angina, palpitations, dyspnea on exertion, peripheral edema.  Respiratory: Negative for dyspnea at rest, dyspnea  on exertion, cough, sputum, wheezing.  GI: See history of present illness. GU:  Negative for dysuria, hematuria, urinary incontinence, urinary frequency, nocturnal urination.  Endo: Negative for unusual weight change.    Physical Examination:   There were no vitals taken for this visit.  General: Well-nourished, well-developed in no acute distress.  Eyes: No icterus. Conjunctivae pink. Mouth: Oropharyngeal mucosa moist and pink , no lesions erythema or exudate. Lungs: Clear to auscultation bilaterally. Non-labored. Heart: Regular rate and rhythm, no murmurs rubs or gallops.  Abdomen: Bowel sounds are normal, nontender, nondistended, no hepatosplenomegaly or masses,  no abdominal bruits or hernia , no rebound or guarding.   Extremities: No lower extremity edema. No clubbing or deformities. Neuro: Alert and oriented x 3.  Grossly intact. Skin: Warm and dry, no jaundice.   Psych: Alert and cooperative, normal mood and affect.   Imaging Studies: No results found.  Assessment and Plan:   Erin Ramos is a 37 y.o. y/o female  Here to follow up for H pylori - appears to have been treated atleast 3 times with persistence of the bacteria. I looked at what available records  She  Has only been treated with a clarithromycin/Azithromycin /amoxicillin/PPI combination. She is likely resistent to this antibiotics and I began on 08/06/17 to  treat her with a different regime of Bismuth, Flagyl, tetracycline , PPI for 14 days .She did not tolerate the medications after 6 days and not willing to continue. I suggest she waits for a few days and when she is feeling better, she will call me. She says she has a yeast infection and would like some diflucan.    Will check for eradication after completing course of antibiotics.   Dr Jonathon Bellows  MD,MRCP [facility]) Follow up in 4 weeks

## 2017-09-29 ENCOUNTER — Ambulatory Visit: Payer: BC Managed Care – PPO | Admitting: Gastroenterology

## 2018-10-19 ENCOUNTER — Other Ambulatory Visit: Payer: Self-pay

## 2018-10-19 DIAGNOSIS — Z20822 Contact with and (suspected) exposure to covid-19: Secondary | ICD-10-CM

## 2018-10-19 DIAGNOSIS — Z20828 Contact with and (suspected) exposure to other viral communicable diseases: Secondary | ICD-10-CM

## 2018-10-21 LAB — NOVEL CORONAVIRUS, NAA: SARS-CoV-2, NAA: NOT DETECTED

## 2018-12-29 ENCOUNTER — Ambulatory Visit: Payer: BC Managed Care – PPO | Attending: Internal Medicine

## 2018-12-29 ENCOUNTER — Other Ambulatory Visit: Payer: Self-pay

## 2018-12-29 DIAGNOSIS — Z20822 Contact with and (suspected) exposure to covid-19: Secondary | ICD-10-CM

## 2018-12-30 ENCOUNTER — Other Ambulatory Visit: Payer: BC Managed Care – PPO

## 2018-12-31 LAB — NOVEL CORONAVIRUS, NAA: SARS-CoV-2, NAA: DETECTED — AB

## 2020-01-30 ENCOUNTER — Ambulatory Visit: Payer: Self-pay

## 2020-02-26 ENCOUNTER — Ambulatory Visit (HOSPITAL_COMMUNITY)
Admission: EM | Admit: 2020-02-26 | Discharge: 2020-02-26 | Disposition: A | Discharge status: Other Acute Inpatient Hospital | Payer: BC Managed Care – PPO

## 2020-02-26 ENCOUNTER — Other Ambulatory Visit: Payer: Self-pay

## 2020-02-26 ENCOUNTER — Encounter (HOSPITAL_COMMUNITY): Payer: Self-pay | Admitting: Emergency Medicine

## 2020-02-26 ENCOUNTER — Emergency Department (HOSPITAL_COMMUNITY): Payer: BC Managed Care – PPO

## 2020-02-26 ENCOUNTER — Emergency Department (HOSPITAL_COMMUNITY)
Admission: EM | Admit: 2020-02-26 | Discharge: 2020-02-26 | Disposition: A | Discharge status: Home / Self Care | Payer: BC Managed Care – PPO | Attending: Emergency Medicine | Admitting: Emergency Medicine

## 2020-02-26 DIAGNOSIS — R0789 Other chest pain: Secondary | ICD-10-CM | POA: Insufficient documentation

## 2020-02-26 DIAGNOSIS — R519 Headache, unspecified: Secondary | ICD-10-CM | POA: Diagnosis not present

## 2020-02-26 DIAGNOSIS — R002 Palpitations: Secondary | ICD-10-CM | POA: Insufficient documentation

## 2020-02-26 LAB — BASIC METABOLIC PANEL
Anion gap: 14 (ref 5–15)
BUN: 8 mg/dL (ref 6–20)
CO2: 15 mmol/L — ABNORMAL LOW (ref 22–32)
Calcium: 9.5 mg/dL (ref 8.9–10.3)
Chloride: 107 mmol/L (ref 98–111)
Creatinine, Ser: 0.74 mg/dL (ref 0.44–1.00)
GFR, Estimated: >60 mL/min (ref >60)
Glucose, Bld: 190 mg/dL — ABNORMAL HIGH (ref 70–99)
Potassium: 3.5 mmol/L (ref 3.5–5.1)
Sodium: 136 mmol/L (ref 135–145)

## 2020-02-26 LAB — I-STAT VENOUS BLOOD GAS, ED
Acid-base deficit: 5 mmol/L — ABNORMAL HIGH (ref 0.0–2.0)
Bicarbonate: 18.9 mmol/L — ABNORMAL LOW (ref 20.0–28.0)
Calcium, Ion: 1.27 mmol/L (ref 1.15–1.40)
HCT: 44 % (ref 36.0–46.0)
Hemoglobin: 15 g/dL (ref 12.0–15.0)
O2 Saturation: 78 %
Potassium: 3.9 mmol/L (ref 3.5–5.1)
Sodium: 142 mmol/L (ref 135–145)
TCO2: 20 mmol/L — ABNORMAL LOW (ref 22–32)
pCO2, Ven: 32.9 mmHg — ABNORMAL LOW (ref 44.0–60.0)
pH, Ven: 7.368 (ref 7.250–7.430)
pO2, Ven: 43 mmHg (ref 32.0–45.0)

## 2020-02-26 LAB — CBC
HCT: 42.1 % (ref 36.0–46.0)
Hemoglobin: 13.8 g/dL (ref 12.0–15.0)
MCH: 29.2 pg (ref 26.0–34.0)
MCHC: 32.8 g/dL (ref 30.0–36.0)
MCV: 89.2 fL (ref 80.0–100.0)
Platelets: 309 10*3/uL (ref 150–400)
RBC: 4.72 MIL/uL (ref 3.87–5.11)
RDW: 15.1 % (ref 11.5–15.5)
WBC: 9.9 10*3/uL (ref 4.0–10.5)
nRBC: 0 % (ref 0.0–0.2)

## 2020-02-26 LAB — MAGNESIUM: Magnesium: 2 mg/dL (ref 1.7–2.4)

## 2020-02-26 LAB — LACTIC ACID, PLASMA
Lactic Acid, Venous: 3.2 mmol/L (ref 0.5–1.9)
Lactic Acid, Venous: 1.7 mmol/L (ref 0.5–1.9)

## 2020-02-26 LAB — I-STAT BETA HCG BLOOD, ED (MC, WL, AP ONLY): I-stat hCG, quantitative: <5 m[IU]/mL (ref <5)

## 2020-02-26 LAB — TROPONIN I (HIGH SENSITIVITY)
Troponin I (High Sensitivity): 5 ng/L (ref <18)
Troponin I (High Sensitivity): 4 ng/L (ref <18)

## 2020-02-26 MED ORDER — SODIUM CHLORIDE 0.9 % IV BOLUS
1000.0000 mL | Freq: Once | INTRAVENOUS | Status: AC
  Administered 2020-02-26: 1000 mL via INTRAVENOUS

## 2020-02-26 NOTE — ED Triage Notes (Addendum)
Pt reports heart pounding, L arm pain, headache, and intermittent SOB x 3 days.

## 2020-02-26 NOTE — ED Provider Notes (Signed)
[place] EMERGENCY DEPARTMENT Provider Note   CSN: [id number] Arrival date & time: 02/26/20  1748     History Chief Complaint  Patient presents with  . Chest Pain    Erin Ramos is a 40 y.o. female.  Patient speaks some [place] but primarily [place], utilized Art therapist throughout entirety of visit.  Patient reports that she has been struggling with near daily headaches over the past couple months. Reports that she has been seen by many different medical providers, has had CT head imaging which was negative. She does not have an this daily headache. Described as frontal, moderate to severe, had some improvement with prednisone, no improvement with the prescribed Topamax or gabapentin.  She reports the primary reason she came to the ER today was actually because of palpitations and not her headache. Reports over the past few days she has been having intermittent episodes of heart pounding sensation, slight discomfort in her chest and left arm. Currently she is not experiencing either of these symptoms. Not associated with exertion or shortness of breath.  Denies known personal or family hx of CAD. Nonsmoker.   HPI     Past Medical History:  Diagnosis Date  . Medical history non-contributory     Patient Active Problem List   Diagnosis Date Noted  . 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 LAPAROSCOPIC OVARIAN CYSTECTOMY Right 10/27/2014   Procedure: LAPAROSCOPIC OVARIAN CYSTECTOMY, peritoneal biopsy;  Surgeon: Lorette Ang, MD;  Location: [facility] ORS;  Service: Gynecology;  Laterality: Right;  . NO PAST SURGERIES       OB History   No obstetric history on file.     No family history on file.  Social History   Tobacco Use  . Smoking status: Never Smoker  . Smokeless tobacco: Never Used  Substance Use Topics  . Alcohol use: No  . Drug use: No    Home  Medications Prior to Admission medications   Medication Sig Start Date End Date Taking? Authorizing Provider  amitriptyline (ELAVIL) 10 MG tablet  07/21/17   [provider]  butalbital-acetaminophen-caffeine (FIORICET, ESGIC) [phone] MG tablet  07/21/17   [provider]  cyclobenzaprine (FLEXERIL) 5 MG tablet  07/27/17   [provider]  fexofenadine (ALLEGRA) 180 MG tablet TK 1 T PO QD PRN 07/06/17   [provider]  fluticasone (FLONASE) 50 MCG/ACT nasal spray SHAKE LQ AND U 1 SPR IEN QAM 07/06/17   [provider]  HYDROcodone-acetaminophen (NORCO/VICODIN) 5-325 MG tablet TK 1 T PO BID PRN 05/02/17   [provider]  montelukast (SINGULAIR) 10 MG tablet TK 1 T PO QHS 07/06/17   [provider]  Norgestim-Eth Estrad Triphasic (ORTHO TRI-CYCLEN, 28, PO) Take 1 tablet by mouth daily.    [provider]  omeprazole (PRILOSEC) 40 MG capsule Take 1 capsule (40 mg total) by mouth 2 (two) times daily for 15 days. 08/06/17 08/21/17  Jonathon Bellows, MD  ondansetron (ZOFRAN) 4 MG tablet Take by mouth. 11/10/14   [provider]  oxyCODONE-acetaminophen (ROXICET) 5-325 MG tablet Take 2 tablets by mouth every 4 (four) hours as needed for severe pain (Can take 1 every 4 hours as needed for moderate pain). Patient not taking: Reported on 08/13/2017 10/27/14   Lorette Ang, MD  sucralfate (CARAFATE) 1 g tablet  07/13/17   [provider]  tiZANidine (ZANAFLEX) 4 MG tablet  06/15/17  [provider]    Allergies    Shellfish allergy  Review of Systems   Review of Systems  Constitutional: Negative for chills and fever.  HENT: Negative for ear pain and sore throat.   Eyes: Negative for pain and visual disturbance.  Respiratory: Negative for cough and shortness of breath.   Cardiovascular: Positive for palpitations. Negative for chest pain.  Gastrointestinal: Negative for abdominal pain and vomiting.  Genitourinary:  Negative for dysuria and hematuria.  Musculoskeletal: Negative for arthralgias and back pain.  Skin: Negative for color change and rash.  Neurological: Positive for headaches. Negative for seizures and syncope.  All other systems reviewed and are negative.   Physical Exam Updated Vital Signs BP 121/76 (BP Location: Right Arm)   Pulse 84   Temp 98.8 F (37.1 C) (Oral)   Resp 19   LMP 02/13/2020   SpO2 100%   Physical Exam Vitals and nursing note reviewed.  Constitutional:      General: She is not in acute distress.    Appearance: She is well-developed and well-nourished.  HENT:     Head: Normocephalic and atraumatic.  Eyes:     Conjunctiva/sclera: Conjunctivae normal.  Cardiovascular:     Rate and Rhythm: Normal rate and regular rhythm.     Heart sounds: No murmur heard.   Pulmonary:     Effort: Pulmonary effort is normal. No respiratory distress.     Breath sounds: Normal breath sounds.  Abdominal:     Palpations: Abdomen is soft.     Tenderness: There is no abdominal tenderness.  Musculoskeletal:        General: No edema.     Cervical back: Neck supple.  Skin:    General: Skin is warm and dry.     Capillary Refill: Capillary refill takes less than 2 seconds.  Neurological:     General: No focal deficit present.     Mental Status: She is alert and oriented to person, place, and time.  Psychiatric:        Mood and Affect: Mood and affect normal.     ED Results / Procedures / Treatments   Labs (all labs ordered are listed, but only abnormal results are displayed) Labs Reviewed  BASIC METABOLIC PANEL - Abnormal; Notable for the following components:      Result Value   CO2 15 (*)    Glucose, Bld 190 (*)    All other components within normal limits  LACTIC ACID, PLASMA - Abnormal; Notable for the following components:   Lactic Acid, Venous 3.2 (*)    All other components within normal limits  I-STAT VENOUS BLOOD GAS, ED - Abnormal; Notable for the following  components:   pCO2, Ven 32.9 (*)    Bicarbonate 18.9 (*)    TCO2 20 (*)    Acid-base deficit 5.0 (*)    All other components within normal limits  CBC  MAGNESIUM  LACTIC ACID, PLASMA  I-STAT BETA HCG BLOOD, ED ([facility], [facility], [facility] ONLY)  TROPONIN I (HIGH SENSITIVITY)  TROPONIN I (HIGH SENSITIVITY)    EKG EKG Interpretation  Date/Time:  Sunday February 26 2020 19:39:08 EST Ventricular Rate:  103 PR Interval:    QRS Duration: 101 QT Interval:  349 QTC Calculation: 457 R Axis:   40 Text Interpretation: Sinus tachycardia RSR' in V1 or V2, right VCD or RVH Borderline T abnormalities, anterior leads Confirmed by Madalyn Rob [phone]) on 02/26/2020 8:09:06 PM   Radiology DG Chest 2 View  Result  Date: 02/26/2020 CLINICAL DATA:  Chest pain.  Palpitations. EXAM: CHEST - 2 VIEW COMPARISON:  None. FINDINGS: The cardiomediastinal contours are normal. The lungs are clear. Pulmonary vasculature is normal. No consolidation, pleural effusion, or pneumothorax. No acute osseous abnormalities are seen. IMPRESSION: Negative radiographs of the chest. Electronically Signed   By: Keith Rake M.D.   On: 02/26/2020 20:57    Procedures Procedures   Medications Ordered in ED Medications  sodium chloride 0.9 % bolus 1,000 mL (0 mLs Intravenous Stopped 02/26/20 2202)    ED Course  I have reviewed the triage vital signs and the nursing notes.  Pertinent labs & imaging results that were available during my care of the patient were reviewed by me and considered in my medical decision making (see chart for details).    MDM Rules/Calculators/A&P                          40 year old lady presenting to the emergency room with concern for chest pain, palpitations, headaches. Regarding the headache, this has been going on for many weeks and she has been evaluated by multiple other providers per patient, she reports having normal head imaging and has a follow-up with neurology in a couple weeks. No acute  changes in these symptoms today; will defer further workup and management to PCP and neuro.   Her primary concern today was the heart pounding sensation. On arrival, patient noted to be in sinus tachycardia, the first EKG showed a prolonged QT though the quality of the EKG was poor relatively. Her basic labs were all normal except her bicarb was low. Based on the review of this initial information, checked repeat EKG, mag, lactate, VBG to further investigate. Repeat EKG showed a normal QT interval, magnesium within normal limits, pH normal. Initial lactate was modestly elevated. Patient was provided fluids and time. While on cardiac monitor, patient had no events. She remained in normal sinus rhythm with a normal rate.   At time of sign out, repeat lactate pending. If pt remains well and repeat lactate normal, plan to discharge home with primary care follow-up and neurology follow-up.   Final Clinical Impression(s) / ED Diagnoses Final diagnoses:  Acute nonintractable headache, unspecified headache type  Palpitations    Rx / DC Orders ED Discharge Orders    None       Lucrezia Starch, MD 02/27/20 1235

## 2020-02-26 NOTE — ED Triage Notes (Signed)
Pt has ad a HA for 7 weeks and had a neg Head CT.

## 2020-02-26 NOTE — ED Notes (Signed)
Notified Dr. Roslynn Amble of critical lactic 3.2. No new orders at this time.

## 2020-02-26 NOTE — ED Provider Notes (Signed)
Patient signed out to me by R. Roslynn Amble, MD.  Please see previous notes for further history.  In brief, patient presented for evaluation of palpitations and headache.  Exam overall reassuring.  Patient very anxious.  Troponin is pending, plan for discharge if normal.  Patient is receiving fluids for tachycardia.  Additionally, mildly elevated lactic, will recheck after fluids.  Repeat labs are normal.  Troponin negative.  On reassessment, patient reports headache is completely resolved.  Palpitations improved.  Heart rate normal.  Patient and family requesting neurology information as in [place], as they have an appointment with [facility] but not for another month.  Discussed symptomatic management as needed.  At this time, patient appears safe for discharge.  Return precautions given.  Patient states she understands and agrees to plan.     Franchot Heidelberg, PA-C 02/26/20 2329    Lucrezia Starch, MD 02/27/20 1243

## 2020-03-26 DIAGNOSIS — G44221 Chronic tension-type headache, intractable: Secondary | ICD-10-CM | POA: Insufficient documentation

## 2020-03-26 DIAGNOSIS — K8020 Calculus of gallbladder without cholecystitis without obstruction: Secondary | ICD-10-CM | POA: Insufficient documentation

## 2020-03-26 DIAGNOSIS — M7918 Myalgia, other site: Secondary | ICD-10-CM | POA: Insufficient documentation

## 2020-04-03 ENCOUNTER — Other Ambulatory Visit: Payer: Self-pay | Admitting: Otolaryngology

## 2020-04-04 ENCOUNTER — Other Ambulatory Visit: Payer: Self-pay | Admitting: Otolaryngology

## 2020-04-04 DIAGNOSIS — R519 Headache, unspecified: Secondary | ICD-10-CM

## 2020-04-06 ENCOUNTER — Other Ambulatory Visit: Payer: Self-pay | Admitting: Otolaryngology

## 2020-04-06 DIAGNOSIS — G4452 New daily persistent headache (NDPH): Secondary | ICD-10-CM

## 2020-04-16 ENCOUNTER — Ambulatory Visit: Payer: BC Managed Care – PPO

## 2020-05-07 ENCOUNTER — Ambulatory Visit: Payer: BC Managed Care – PPO

## 2021-01-13 DIAGNOSIS — I82402 Acute embolism and thrombosis of unspecified deep veins of left lower extremity: Secondary | ICD-10-CM

## 2021-01-13 HISTORY — DX: Acute embolism and thrombosis of unspecified deep veins of left lower extremity: I82.402

## 2021-01-29 ENCOUNTER — Ambulatory Visit
Admission: RE | Admit: 2021-01-29 | Discharge: 2021-01-29 | Disposition: A | Discharge status: Home / Self Care | Payer: BC Managed Care – PPO | Source: Ambulatory Visit | Attending: Family Medicine | Admitting: Family Medicine

## 2021-01-29 ENCOUNTER — Other Ambulatory Visit: Payer: Self-pay | Admitting: Family Medicine

## 2021-01-29 ENCOUNTER — Other Ambulatory Visit: Payer: Self-pay

## 2021-01-29 DIAGNOSIS — M79662 Pain in left lower leg: Secondary | ICD-10-CM

## 2021-02-06 ENCOUNTER — Encounter: Payer: Self-pay | Admitting: Oncology

## 2021-02-06 ENCOUNTER — Inpatient Hospital Stay: Payer: BC Managed Care – PPO | Attending: Oncology | Admitting: Oncology

## 2021-02-06 ENCOUNTER — Inpatient Hospital Stay: Payer: BC Managed Care – PPO

## 2021-02-06 ENCOUNTER — Other Ambulatory Visit: Payer: Self-pay

## 2021-02-06 VITALS — BP 128/79 | HR 97 | Temp 96.3°F | Wt 174.0 lb

## 2021-02-06 DIAGNOSIS — Z7901 Long term (current) use of anticoagulants: Secondary | ICD-10-CM | POA: Insufficient documentation

## 2021-02-06 DIAGNOSIS — I82462 Acute embolism and thrombosis of left calf muscular vein: Secondary | ICD-10-CM | POA: Diagnosis present

## 2021-02-06 DIAGNOSIS — Z8249 Family history of ischemic heart disease and other diseases of the circulatory system: Secondary | ICD-10-CM | POA: Diagnosis not present

## 2021-02-06 DIAGNOSIS — N920 Excessive and frequent menstruation with regular cycle: Secondary | ICD-10-CM | POA: Diagnosis not present

## 2021-02-06 DIAGNOSIS — I824Y2 Acute embolism and thrombosis of unspecified deep veins of left proximal lower extremity: Secondary | ICD-10-CM

## 2021-02-06 DIAGNOSIS — N921 Excessive and frequent menstruation with irregular cycle: Secondary | ICD-10-CM

## 2021-02-06 LAB — COMPREHENSIVE METABOLIC PANEL
ALT: 13 U/L (ref 0–44)
AST: 21 U/L (ref 15–41)
Albumin: 3.8 g/dL (ref 3.5–5.0)
Alkaline Phosphatase: 85 U/L (ref 38–126)
Anion gap: 9 (ref 5–15)
BUN: 13 mg/dL (ref 6–20)
CO2: 23 mmol/L (ref 22–32)
Calcium: 9.2 mg/dL (ref 8.9–10.3)
Chloride: 105 mmol/L (ref 98–111)
Creatinine, Ser: 0.44 mg/dL (ref 0.44–1.00)
GFR, Estimated: >60 mL/min (ref >60)
Glucose, Bld: 128 mg/dL — ABNORMAL HIGH (ref 70–99)
Potassium: 3.6 mmol/L (ref 3.5–5.1)
Sodium: 137 mmol/L (ref 135–145)
Total Bilirubin: 0.4 mg/dL (ref 0.3–1.2)
Total Protein: 7.5 g/dL (ref 6.5–8.1)

## 2021-02-06 LAB — CBC WITH DIFFERENTIAL/PLATELET
Abs Immature Granulocytes: 0.02 10*3/uL (ref 0.00–0.07)
Basophils Absolute: 0.1 10*3/uL (ref 0.0–0.1)
Basophils Relative: 1 %
Eosinophils Absolute: 0 10*3/uL (ref 0.0–0.5)
Eosinophils Relative: 1 %
HCT: 38.5 % (ref 36.0–46.0)
Hemoglobin: 12.5 g/dL (ref 12.0–15.0)
Immature Granulocytes: 0 %
Lymphocytes Relative: 31 %
Lymphs Abs: 1.9 10*3/uL (ref 0.7–4.0)
MCH: 27.7 pg (ref 26.0–34.0)
MCHC: 32.5 g/dL (ref 30.0–36.0)
MCV: 85.2 fL (ref 80.0–100.0)
Monocytes Absolute: 0.5 10*3/uL (ref 0.1–1.0)
Monocytes Relative: 8 %
Neutro Abs: 3.6 10*3/uL (ref 1.7–7.7)
Neutrophils Relative %: 59 %
Platelets: 422 10*3/uL — ABNORMAL HIGH (ref 150–400)
RBC: 4.52 MIL/uL (ref 3.87–5.11)
RDW: 16.8 % — ABNORMAL HIGH (ref 11.5–15.5)
WBC: 6.2 10*3/uL (ref 4.0–10.5)
nRBC: 0 % (ref 0.0–0.2)

## 2021-02-06 MED ORDER — APIXABAN 5 MG PO TABS: 5.0000 mg | ORAL_TABLET | Freq: Every day | ORAL | 2 refills | Status: DC

## 2021-02-06 NOTE — Progress Notes (Signed)
Hematology/Oncology Consult note [phone] [phone]      Patient Care Team: [name], MD as PCP - General (Family Medicine)  REFERRING PROVIDER: [name], MD  CHIEF COMPLAINTS/REASON FOR VISIT:  Evaluation of acute left lower extremity thrombus  HISTORY OF PRESENTING ILLNESS:   Erin Ramos is a  41 y.o.  female with PMH listed below was seen in consultation at the request of  [name], MD  for evaluation of acute left lower extremity deep vein thrombosis.  She recently had a prolonged trip flying back to [place]. Her trip was composed of a 40 minutes airflight followed by a 4 hour flight followed by 10 hours within the airport followed by 2 hours flying to [place] followed by 10 hours waiting the airport followed by her airflight back to [place].  She started to feel left lower extremity swelling/discomfort after she arrives [place].  She could not get to see her primary care provider and to 01/28/2021 and left lower extremity ultrasound was done on 01/29/2021 which showed popliteal vein and calf vein thrombosis.  Patient was started on Eliquis starting pack on 01/30/2021 and she starts Eliquis 5 mg twice daily today.  She tolerates anticoagulation.  No bleeding events. She was also taking birth control pill which was stopped on 01/29/2021.  Reports having withdrawal bleeding heavily. Some blood work done at primary care provider office which showed an hemoglobin 11.9.  She denies any prior history of thrombosis.  Her maternal aunt has a history of blood clot.  She denies any shortness of breath, hemoptysis.  Since her start of Eliquis, left lower extremity tenderness has improved.  She still has residual swelling.  Review of Systems  Constitutional:  Negative for appetite change, chills, fatigue and fever.  HENT:   Negative for hearing loss and voice change.   Eyes:  Negative for eye problems.  Respiratory:  Negative for  chest tightness and cough.   Cardiovascular:  Negative for chest pain.  Gastrointestinal:  Negative for abdominal distention, abdominal pain and blood in stool.  Endocrine: Negative for hot flashes.  Genitourinary:  Negative for difficulty urinating and frequency.   Musculoskeletal:  Negative for arthralgias.  Skin:  Negative for itching and rash.  Neurological:  Negative for extremity weakness.  Hematological:  Negative for adenopathy.  Psychiatric/Behavioral:  Negative for confusion.   Left lower extremity tenderness and discomfort  MEDICAL HISTORY:  Past Medical History:  Diagnosis Date   Medical history non-contributory    Migraines     SURGICAL HISTORY: Past Surgical History:  Procedure Laterality Date   LAPAROSCOPIC OVARIAN CYSTECTOMY Right 10/27/2014   Procedure: LAPAROSCOPIC OVARIAN CYSTECTOMY, peritoneal biopsy;  Surgeon: Lorette Ang, MD;  Location: [facility] ORS;  Service: Gynecology;  Laterality: Right;   NO PAST SURGERIES      SOCIAL HISTORY: Social History   Socioeconomic History   Marital status: Married    Spouse name: Not on file   Number of children: Not on file   Years of education: Not on file   Highest education level: Not on file  Occupational History   Not on file  Tobacco Use   Smoking status: Never   Smokeless tobacco: Never  Vaping Use   Vaping Use: Never used  Substance and Sexual Activity   Alcohol use: No   Drug use: No   Sexual activity: Not on file  Other Topics Concern   Not on file  Social History Narrative   Not on file  Social Determinants of Health   Financial Resource Strain: Not on file  Food Insecurity: Not on file  Transportation Needs: Not on file  Physical Activity: Not on file  Stress: Not on file  Social Connections: Not on file  Intimate Partner Violence: Not on file    FAMILY HISTORY: Family History  Problem Relation Age of Onset   Diabetes Mother    Clotting disorder Maternal Aunt     ALLERGIES:  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  MEDICATIONS:  Current Outpatient Medications  Medication Sig Dispense Refill   acyclovir cream (ZOVIRAX) 5 % Sparingly Topical 4 Times Daily     baclofen (LIORESAL) 10 MG tablet Take 10 mg by mouth 3 (three) times daily. 1 Tablet(s) By Mouth 3 Times Daily     famotidine (PEPCID) 20 MG tablet Take 20 mg by mouth 2 (two) times daily. 1 Tablet(s) By Mouth Twice Daily     fexofenadine (ALLEGRA) 180 MG tablet Take by mouth. Take 180 mg by mouth daily     fluticasone (FLONASE) 50 MCG/ACT nasal spray SHAKE LQ AND U 1 SPR IEN QAM  5   omeprazole (PRILOSEC) 40 MG capsule Take 1 capsule (40 mg total) by mouth 2 (two) times daily for 15 days. 30 capsule 0   rizatriptan (MAXALT) 10 MG tablet Take 1 tablet by mouth at ONSET of migraine or aura. May repeat in 2 HOURS if needed. DO NOT EXCEED 2 doses/day, 2 days/week, 8 doses/month.     triamcinolone cream (KENALOG) 0.1 % Apply topically. Topical     apixaban (ELIQUIS) 5 MG TABS tablet Take 1 tablet (5 mg total) by mouth daily. 60 tablet 2   EPINEPHrine 0.3 mg/0.3 mL IJ SOAJ injection Inject into the muscle. SMARTSIG:IM (Patient not taking: Reported on 02/06/2021)     ondansetron (ZOFRAN-ODT) 4 MG disintegrating tablet Take by mouth. Take 1 tablet (4 mg total) by mouth every 8 (eight) hours as needed for Nausea May take TWO at a time IF NEEDED (Patient not taking: Reported on 02/06/2021)     sucralfate (CARAFATE) 1 g tablet  (Patient not taking: Reported on 02/06/2021)     No current facility-administered medications for this visit.     PHYSICAL EXAMINATION: ECOG PERFORMANCE STATUS: 0 - Asymptomatic Vitals:   02/06/21 1051  BP: 128/79  Pulse: 97  Temp: (!) 96.3 F (35.7 C)   Filed Weights   02/06/21 1051  Weight: 174 lb (78.9 kg)    Physical Exam Constitutional:      General: She is not in acute distress. HENT:     Head: Normocephalic and atraumatic.  Eyes:     General: No scleral icterus. Cardiovascular:      Rate and Rhythm: Normal rate and regular rhythm.     Heart sounds: Normal heart sounds.  Pulmonary:     Effort: Pulmonary effort is normal. No respiratory distress.     Breath sounds: No wheezing.  Abdominal:     General: Bowel sounds are normal. There is no distension.     Palpations: Abdomen is soft.  Musculoskeletal:        General: No deformity. Normal range of motion.     Cervical back: Normal range of motion and neck supple.     Comments: Left calf mild tenderness with palpation, some bulging veins no significant edema  Skin:    General: Skin is warm and dry.     Findings: No erythema or rash.  Neurological:     Mental Status:  She is alert and oriented to person, place, and time. Mental status is at baseline.     Cranial Nerves: No cranial nerve deficit.     Coordination: Coordination normal.  Psychiatric:        Mood and Affect: Mood normal.    LABORATORY DATA:  I have reviewed the data as listed Lab Results  Component Value Date   WBC 6.2 02/06/2021   HGB 12.5 02/06/2021   HCT 38.5 02/06/2021   MCV 85.2 02/06/2021   PLT 422 (H) 02/06/2021   Recent Labs    02/26/20 1811 02/26/20 1948 02/06/21 1148  NA 136 142 137  K 3.5 3.9 3.6  CL 107  --  105  CO2 15*  --  23  GLUCOSE 190*  --  128*  BUN 8  --  13  CREATININE 0.74  --  0.44  CALCIUM 9.5  --  9.2  GFRNONAA >60  --  >60  PROT  --   --  7.5  ALBUMIN  --   --  3.8  AST  --   --  21  ALT  --   --  13  ALKPHOS  --   --  85  BILITOT  --   --  0.4   Iron/TIBC/Ferritin/ %Sat No results found for: IRON, TIBC, FERRITIN, IRONPCTSAT    RADIOGRAPHIC STUDIES: I have personally reviewed the radiological images as listed and agreed with the findings in the report. US Venous Img Lower Unilateral Left (DVT)  Result Date: 01/29/2021 CLINICAL DATA:  Left posterior calf pain.  Suspect varicose veins EXAM: Left LOWER EXTREMITY VENOUS DOPPLER ULTRASOUND TECHNIQUE: Gray-scale sonography with graded compression, as well  as color Doppler and duplex ultrasound were performed to evaluate the lower extremity deep venous systems from the level of the common femoral vein and including the common femoral, femoral, profunda femoral, popliteal and calf veins including the posterior tibial, peroneal and gastrocnemius veins when visible. The superficial great saphenous vein was also interrogated. Spectral Doppler was utilized to evaluate flow at rest and with distal augmentation maneuvers in the common femoral, femoral and popliteal veins. COMPARISON:  None. FINDINGS: Contralateral Common Femoral Vein: Respiratory phasicity is normal and symmetric with the symptomatic side. No evidence of thrombus. Normal compressibility. Common Femoral Vein: No evidence of thrombus. Normal compressibility, respiratory phasicity and response to augmentation. Saphenofemoral Junction: No evidence of thrombus. Normal compressibility and flow on color Doppler imaging. Profunda Femoral Vein: No evidence of thrombus. Normal compressibility and flow on color Doppler imaging. Femoral Vein: No evidence of thrombus. Normal compressibility, respiratory phasicity and response to augmentation. Popliteal Vein: Thrombus in the popliteal vein extending from left lesser saphenous vein thrombus. Calf Veins: Thrombus noted in calf veins. Superficial Great Saphenous Vein: Superficial thrombus in the lesser saphenous vein and greater saphenous vein. 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 Venous Reflux:  None. Other Findings:  None. IMPRESSION: Thrombus in the popliteal vein and calf veins. Superficial thrombus lesser saphenous vein and greater saphenous vein. Multiple calf vein varicosities. Electronically Signed   By: Franchot Gallo M.D.   On: 01/29/2021 16:17      ASSESSMENT & PLAN:  1. Acute deep vein thrombosis (DVT) of calf muscle vein of left lower extremity (HCC)   2. Family history of blood clots   3. Menorrhagia with irregular cycle    #Acute left lower  extremity DVT, likely provoked due to immobilization due to travel as well as OCP. I agree with anticoagulation with Eliquis.  Plan 3 to 6 months of anticoagulation.  I will repeat left lower extremity ultrasound in 3 months. Recommend patient to discuss with gynecology about alternative contraceptive measures.  Discussed with patient that Eliquis is not advised during pregnancy.  Family history of blood clots, check pro thrombin gene mutation and factor V Leiden mutation.  Heavy menstrual bleeding, irregular, this could be secondary to acute withdrawal of OCP.  Monitor.  Check CBC and CMP today.  Orders Placed This Encounter  Procedures   US Venous Img Lower Unilateral Left    Standing Status:   Future    Standing Expiration Date:   02/06/2022    Order Specific Question:   Reason for Exam (SYMPTOM  OR DIAGNOSIS REQUIRED)    Answer:   DVT follow up    Order Specific Question:   Preferred imaging location?    Answer:    [facility]   CBC with Differential/Platelet    Standing Status:   Future    Number of Occurrences:   1    Standing Expiration Date:   02/06/2022   Comprehensive metabolic panel    Standing Status:   Future    Number of Occurrences:   1    Standing Expiration Date:   02/06/2022   Prothrombin gene mutation    Standing Status:   Future    Number of Occurrences:   1    Standing Expiration Date:   02/06/2022   Factor 5 leiden    Standing Status:   Future    Number of Occurrences:   1    Standing Expiration Date:   02/06/2022    All questions were answered. The patient knows to call the clinic with any problems questions or concerns.   [name], MD    Return of visit: Follow-up in 3 months. Thank you for this kind referral and the opportunity to participate in the care of this patient. A copy of today's note is routed to referring provider   Earlie Server, MD, PhD [facility] Hematology Oncology 02/06/2021

## 2021-02-06 NOTE — Progress Notes (Signed)
Patient here to establish care. Blood clot in left leg with occasional pain.

## 2021-02-11 LAB — PROTHROMBIN GENE MUTATION

## 2021-02-12 LAB — FACTOR 5 LEIDEN

## 2021-05-07 ENCOUNTER — Ambulatory Visit
Admission: RE | Admit: 2021-05-07 | Discharge: 2021-05-07 | Disposition: A | Discharge status: Home / Self Care | Payer: BC Managed Care – PPO | Source: Ambulatory Visit | Attending: Oncology | Admitting: Oncology

## 2021-05-07 DIAGNOSIS — I82462 Acute embolism and thrombosis of left calf muscular vein: Secondary | ICD-10-CM | POA: Diagnosis present

## 2021-05-09 ENCOUNTER — Encounter: Payer: Self-pay | Admitting: Oncology

## 2021-05-09 ENCOUNTER — Other Ambulatory Visit: Payer: Self-pay

## 2021-05-09 ENCOUNTER — Inpatient Hospital Stay: Payer: BC Managed Care – PPO | Admitting: Oncology

## 2021-05-09 ENCOUNTER — Inpatient Hospital Stay: Payer: BC Managed Care – PPO | Attending: Oncology

## 2021-05-09 VITALS — BP 114/77 | HR 76 | Temp 95.4°F | Resp 16 | Wt 174.0 lb

## 2021-05-09 DIAGNOSIS — D649 Anemia, unspecified: Secondary | ICD-10-CM

## 2021-05-09 DIAGNOSIS — I82462 Acute embolism and thrombosis of left calf muscular vein: Secondary | ICD-10-CM

## 2021-05-09 DIAGNOSIS — N921 Excessive and frequent menstruation with irregular cycle: Secondary | ICD-10-CM | POA: Diagnosis not present

## 2021-05-09 LAB — CBC WITH DIFFERENTIAL/PLATELET
Abs Immature Granulocytes: 0.01 10*3/uL (ref 0.00–0.07)
Basophils Absolute: 0 10*3/uL (ref 0.0–0.1)
Basophils Relative: 1 %
Eosinophils Absolute: 0.1 10*3/uL (ref 0.0–0.5)
Eosinophils Relative: 1 %
HCT: 31.7 % — ABNORMAL LOW (ref 36.0–46.0)
Hemoglobin: 9.9 g/dL — ABNORMAL LOW (ref 12.0–15.0)
Immature Granulocytes: 0 %
Lymphocytes Relative: 36 %
Lymphs Abs: 2.4 10*3/uL (ref 0.7–4.0)
MCH: 25.8 pg — ABNORMAL LOW (ref 26.0–34.0)
MCHC: 31.2 g/dL (ref 30.0–36.0)
MCV: 82.6 fL (ref 80.0–100.0)
Monocytes Absolute: 0.5 10*3/uL (ref 0.1–1.0)
Monocytes Relative: 7 %
Neutro Abs: 3.7 10*3/uL (ref 1.7–7.7)
Neutrophils Relative %: 55 %
Platelets: 335 10*3/uL (ref 150–400)
RBC: 3.84 MIL/uL — ABNORMAL LOW (ref 3.87–5.11)
RDW: 15.4 % (ref 11.5–15.5)
WBC: 6.6 10*3/uL (ref 4.0–10.5)
nRBC: 0 % (ref 0.0–0.2)

## 2021-05-09 LAB — COMPREHENSIVE METABOLIC PANEL
ALT: 15 U/L (ref 0–44)
AST: 20 U/L (ref 15–41)
Albumin: 3.9 g/dL (ref 3.5–5.0)
Alkaline Phosphatase: 89 U/L (ref 38–126)
Anion gap: 3 — ABNORMAL LOW (ref 5–15)
BUN: 17 mg/dL (ref 6–20)
CO2: 25 mmol/L (ref 22–32)
Calcium: 8.6 mg/dL — ABNORMAL LOW (ref 8.9–10.3)
Chloride: 109 mmol/L (ref 98–111)
Creatinine, Ser: 0.57 mg/dL (ref 0.44–1.00)
GFR, Estimated: >60 mL/min (ref >60)
Glucose, Bld: 96 mg/dL (ref 70–99)
Potassium: 3.8 mmol/L (ref 3.5–5.1)
Sodium: 137 mmol/L (ref 135–145)
Total Bilirubin: 0.3 mg/dL (ref 0.3–1.2)
Total Protein: 7.2 g/dL (ref 6.5–8.1)

## 2021-05-09 MED ORDER — ASPIRIN 81 MG PO TBEC: 162.0000 mg | DELAYED_RELEASE_TABLET | Freq: Every day | ORAL | 2 refills | Status: DC

## 2021-05-09 NOTE — Addendum Note (Signed)
Addended by: Earlie Server on: 05/09/2021 08:12 PM ? ? Modules accepted: Orders ? ?

## 2021-05-09 NOTE — Progress Notes (Signed)
?Hematology/Oncology Progress note ?[phone] [phone] ?  ? ?   ? ? ?Patient Care Team: ?[name], MD as PCP - General (Family Medicine) ? ?REFERRING PROVIDER: ?[name], MD  ?CHIEF COMPLAINTS/REASON FOR VISIT:  ? left lower extremity thrombus ? ?HISTORY OF PRESENTING ILLNESS:  ? ?Erin Ramos is a  41 y.o.  female with PMH listed below was seen in consultation at the request of  [name], MD  for evaluation of acute left lower extremity deep vein thrombosis. ? ?She recently had a prolonged trip flying back to [place]. ?Her trip was composed of a 40 minutes airflight followed by a 4 hour flight followed by 10 hours within the airport followed by 2 hours flying to [place] followed by 10 hours waiting the airport followed by her airflight back to [place].  She started to feel left lower extremity swelling/discomfort after she arrives [place].  She could not get to see her primary care provider and to 01/28/2021 and left lower extremity ultrasound was done on 01/29/2021 which showed popliteal vein and calf vein thrombosis.  Patient was started on Eliquis starting pack on 01/30/2021 and she starts Eliquis 5 mg twice daily today.  She tolerates anticoagulation.  No bleeding events. ?She was also taking birth control pill which was stopped on 01/29/2021.  Reports having withdrawal bleeding heavily. ?Some blood work done at primary care provider office which showed an hemoglobin 11.9. ? ?She denies any prior history of thrombosis.  Her maternal aunt has a history of blood clot.  She denies any shortness of breath, hemoptysis.  Since her start of Eliquis, left lower extremity tenderness has improved.  She still has residual swelling. ? ?INTERVAL HISTORY ?Erin Ramos is a 41 y.o. female who has above history reviewed by me today presents for follow up visit for management of acute left lower extremity DVT ?Is on Eliquis 5 mg twice daily.  She tolerates well.  Left lower  extremity swelling has improved.  Swelling has almost resolved. ? ? ?Review of Systems  ?Constitutional:  Negative for appetite change, chills, fatigue and fever.  ?HENT:   Negative for hearing loss and voice change.   ?Eyes:  Negative for eye problems.  ?Respiratory:  Negative for chest tightness and cough.   ?Cardiovascular:  Negative for chest pain.  ?Gastrointestinal:  Negative for abdominal distention, abdominal pain and blood in stool.  ?Endocrine: Negative for hot flashes.  ?Genitourinary:  Negative for difficulty urinating and frequency.   ?Musculoskeletal:  Negative for arthralgias.  ?Skin:  Negative for itching and rash.  ?Neurological:  Negative for extremity weakness.  ?Hematological:  Negative for adenopathy.  ?Psychiatric/Behavioral:  Negative for confusion.   ? ? ?MEDICAL HISTORY:  ?Past Medical History:  ?Diagnosis Date  ? Medical history non-contributory   ? Migraines   ? ? ?SURGICAL HISTORY: ?Past Surgical History:  ?Procedure Laterality Date  ? LAPAROSCOPIC OVARIAN CYSTECTOMY Right 10/27/2014  ? Procedure: LAPAROSCOPIC OVARIAN CYSTECTOMY, peritoneal biopsy;  Surgeon: Lorette Ang, MD;  Location: [facility] ORS;  Service: Gynecology;  Laterality: Right;  ? NO PAST SURGERIES    ? ? ?SOCIAL HISTORY: ?Social History  ? ?Socioeconomic History  ? Marital status: Married  ?  Spouse name: Not on file  ? Number of children: Not on file  ? Years of education: Not on file  ? Highest education level: Not on file  ?Occupational History  ? Not on file  ?Tobacco Use  ? Smoking status: Never  ? Smokeless tobacco:  Never  ?Vaping Use  ? Vaping Use: Never used  ?Substance and Sexual Activity  ? Alcohol use: No  ? Drug use: No  ? Sexual activity: Not on file  ?Other Topics Concern  ? Not on file  ?Social History Narrative  ? Not on file  ? ?Social Determinants of Health  ? ?Financial Resource Strain: Not on file  ?Food Insecurity: Not on file  ?Transportation Needs: Not on file  ?Physical Activity: Not on file   ?Stress: Not on file  ?Social Connections: Not on file  ?Intimate Partner Violence: Not on file  ? ? ?FAMILY HISTORY: ?Family History  ?Problem Relation Age of Onset  ? Diabetes Mother   ? Clotting disorder Maternal Aunt   ? ? ?ALLERGIES:  is allergic to shellfish allergy. ? ?MEDICATIONS:  ?Current Outpatient Medications  ?Medication Sig Dispense Refill  ? baclofen (LIORESAL) 10 MG tablet Take 10 mg by mouth 3 (three) times daily. 1 Tablet(s) By Mouth 3 Times Daily    ? famotidine (PEPCID) 20 MG tablet Take 20 mg by mouth 2 (two) times daily. 1 Tablet(s) By Mouth Twice Daily    ? fexofenadine (ALLEGRA) 180 MG tablet Take by mouth. Take 180 mg by mouth daily    ? omeprazole (PRILOSEC) 40 MG capsule Take 1 capsule (40 mg total) by mouth 2 (two) times daily for 15 days. 30 capsule 0  ? acyclovir cream (ZOVIRAX) 5 % Sparingly Topical 4 Times Daily    ? aspirin (ASPIRIN 81) 81 MG EC tablet Take 2 tablets (162 mg total) by mouth daily. Swallow whole. 60 tablet 2  ? EPINEPHrine 0.3 mg/0.3 mL IJ SOAJ injection Inject into the muscle. SMARTSIG:IM (Patient not taking: Reported on 02/06/2021)    ? fluticasone (FLONASE) 50 MCG/ACT nasal spray SHAKE LQ AND U 1 SPR IEN QAM (Patient not taking: Reported on 05/09/2021)  5  ? ondansetron (ZOFRAN-ODT) 4 MG disintegrating tablet Take by mouth. Take 1 tablet (4 mg total) by mouth every 8 (eight) hours as needed for Nausea May take TWO at a time IF NEEDED (Patient not taking: Reported on 02/06/2021)    ? rizatriptan (MAXALT) 10 MG tablet Take 1 tablet by mouth at ONSET of migraine or aura. May repeat in 2 HOURS if needed. DO NOT EXCEED 2 doses/day, 2 days/week, 8 doses/month. (Patient not taking: Reported on 05/09/2021)    ? sucralfate (CARAFATE) 1 g tablet  (Patient not taking: Reported on 02/06/2021)    ? triamcinolone cream (KENALOG) 0.1 % Apply topically. Topical (Patient not taking: Reported on 05/09/2021)    ? ?No current facility-administered medications for this visit.   ? ? ? ?PHYSICAL EXAMINATION: ?ECOG PERFORMANCE STATUS: 0 - Asymptomatic ?Vitals:  ? 05/09/21 1357  ?BP: 114/77  ?Pulse: 76  ?Resp: 16  ?Temp: (!) 95.4 ?F (35.2 ?C)  ? ?Filed Weights  ? 05/09/21 1357  ?Weight: 174 lb (78.9 kg)  ? ? ?Physical Exam ?Constitutional:   ?   General: She is not in acute distress. ?HENT:  ?   Head: Normocephalic and atraumatic.  ?Eyes:  ?   General: No scleral icterus. ?Cardiovascular:  ?   Rate and Rhythm: Normal rate and regular rhythm.  ?   Heart sounds: Normal heart sounds.  ?Pulmonary:  ?   Effort: Pulmonary effort is normal. No respiratory distress.  ?   Breath sounds: No wheezing.  ?Abdominal:  ?   General: Bowel sounds are normal. There is no distension.  ?   Palpations: Abdomen  is soft.  ?Musculoskeletal:     ?   General: No deformity. Normal range of motion.  ?   Cervical back: Normal range of motion and neck supple.  ?   Comments: Left calf tenderness has completely resolved. ?+ Varicose vein  ?Skin: ?   General: Skin is warm and dry.  ?   Findings: No erythema or rash.  ?Neurological:  ?   Mental Status: She is alert and oriented to person, place, and time. Mental status is at baseline.  ?   Cranial Nerves: No cranial nerve deficit.  ?   Coordination: Coordination normal.  ?Psychiatric:     ?   Mood and Affect: Mood normal.  ? ? ?LABORATORY DATA:  ?I have reviewed the data as listed ?Lab Results  ?Component Value Date  ? WBC 6.6 05/09/2021  ? HGB 9.9 (L) 05/09/2021  ? HCT 31.7 (L) 05/09/2021  ? MCV 82.6 05/09/2021  ? PLT 335 05/09/2021  ? ?Recent Labs  ?  02/06/21 ?1148 05/09/21 ?1346  ?NA 137 137  ?K 3.6 3.8  ?CL 105 109  ?CO2 23 25  ?GLUCOSE 128* 96  ?BUN 13 17  ?CREATININE 0.44 0.57  ?CALCIUM 9.2 8.6*  ?GFRNONAA >60 >60  ?PROT 7.5 7.2  ?ALBUMIN 3.8 3.9  ?AST 21 20  ?ALT 13 15  ?ALKPHOS 85 89  ?BILITOT 0.4 0.3  ? ? ?Iron/TIBC/Ferritin/ %Sat ?No results found for: IRON, TIBC, FERRITIN, IRONPCTSAT  ? ? ?RADIOGRAPHIC STUDIES: ?I have personally reviewed the radiological images  as listed and agreed with the findings in the report. ?US Venous Img Lower Unilateral Left ? ?Result Date: 05/07/2021 ?CLINICAL DATA:  Left lower extremity DVT follow-up EXAM: LEFT LOWER EXTREMITY VENOUS DOPPLER ULTRASOUND TECHNI

## 2021-05-10 ENCOUNTER — Other Ambulatory Visit: Payer: Self-pay

## 2021-05-10 DIAGNOSIS — D649 Anemia, unspecified: Secondary | ICD-10-CM

## 2021-05-10 DIAGNOSIS — I82462 Acute embolism and thrombosis of left calf muscular vein: Secondary | ICD-10-CM

## 2021-05-10 LAB — RETIC PANEL
Immature Retic Fract: 20.7 % — ABNORMAL HIGH (ref 2.3–15.9)
RBC.: 3.78 MIL/uL — ABNORMAL LOW (ref 3.87–5.11)
Retic Count, Absolute: 52.5 10*3/uL (ref 19.0–186.0)
Retic Ct Pct: 1.4 % (ref 0.4–3.1)
Reticulocyte Hemoglobin: 24 pg — ABNORMAL LOW (ref >27.9)

## 2021-05-10 LAB — IRON AND TIBC
Iron: 12 ug/dL — ABNORMAL LOW (ref 28–170)
Saturation Ratios: 3 % — ABNORMAL LOW (ref 10.4–31.8)
TIBC: 447 ug/dL (ref 250–450)
UIBC: 435 ug/dL

## 2021-05-10 LAB — FERRITIN: Ferritin: 4 ng/mL — ABNORMAL LOW (ref 11–307)

## 2021-05-19 ENCOUNTER — Other Ambulatory Visit: Payer: Self-pay | Admitting: Oncology

## 2021-05-19 DIAGNOSIS — D649 Anemia, unspecified: Secondary | ICD-10-CM

## 2021-05-19 DIAGNOSIS — N921 Excessive and frequent menstruation with irregular cycle: Secondary | ICD-10-CM

## 2021-05-19 MED ORDER — FERROUS SULFATE 325 (65 FE) MG PO TBEC: 325.0000 mg | DELAYED_RELEASE_TABLET | Freq: Two times a day (BID) | ORAL | 2 refills | Status: DC

## 2021-05-20 ENCOUNTER — Telehealth: Payer: Self-pay

## 2021-05-20 NOTE — Telephone Encounter (Signed)
-----   Message from Earlie Server, MD sent at 05/19/2021  9:55 PM EDT ----- ?Please let patient know that her iron level is low recommend ferrous sulfate '325mg'$  BID with meal. Rx sent.  ?Recommend stool softer if he experiences constipation.  ?

## 2021-05-20 NOTE — Telephone Encounter (Signed)
Spoke to patient and informed her of MD recommendation. She wanted to know if Eliquis could have caused her low iron because she did not have that problem before. Pt informed that since she still has her menstrual cycle, her periods could be higher due to Eliquis and then that can affect iron levels.  ?

## 2021-06-18 IMAGING — CR DG CHEST 2V
2 series · 2 of 2 positions shown · non-contrast
Comparison: None.

CLINICAL DATA: Chest pain.  Palpitations.

EXAM:
CHEST - 2 VIEW

[chest pa]
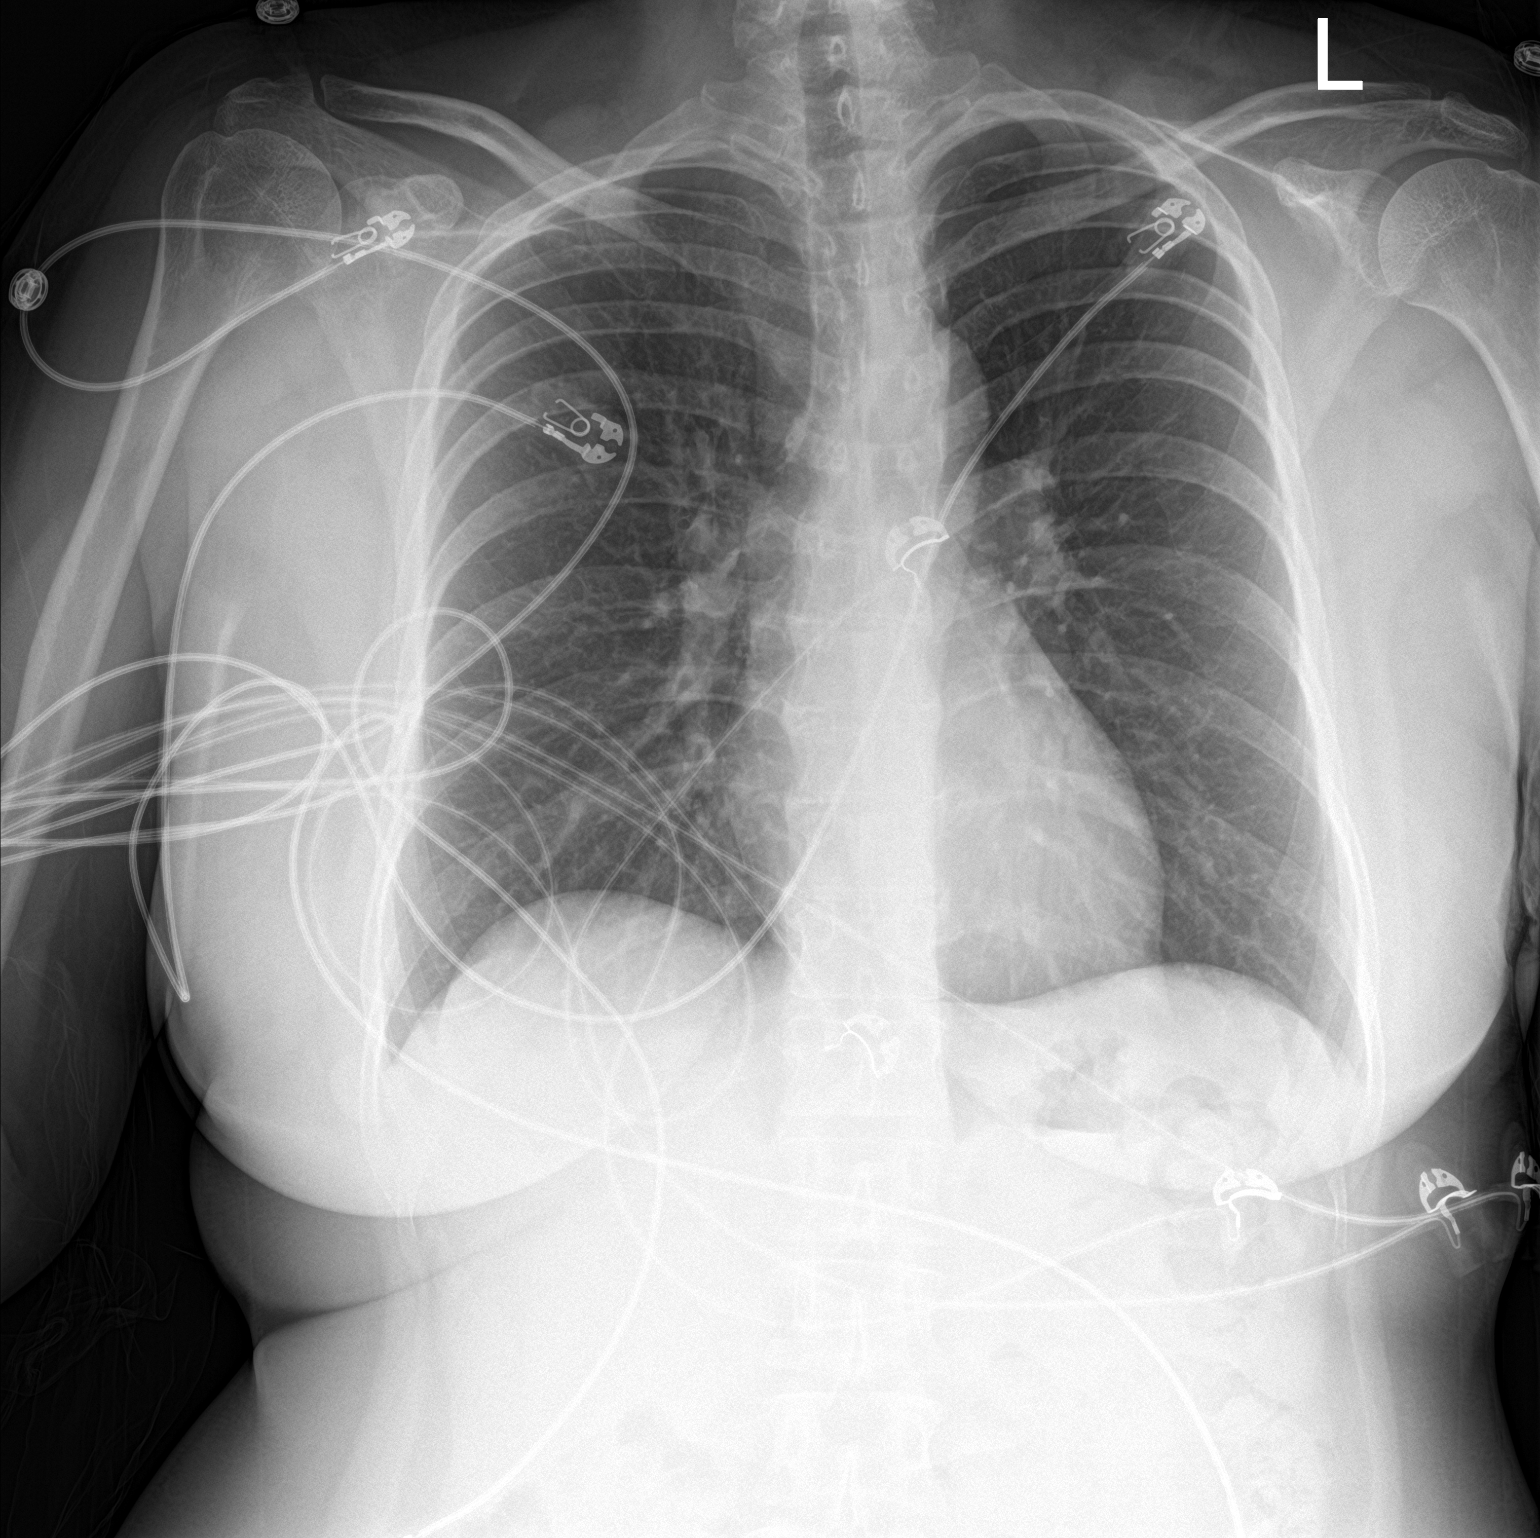

[chest lat]
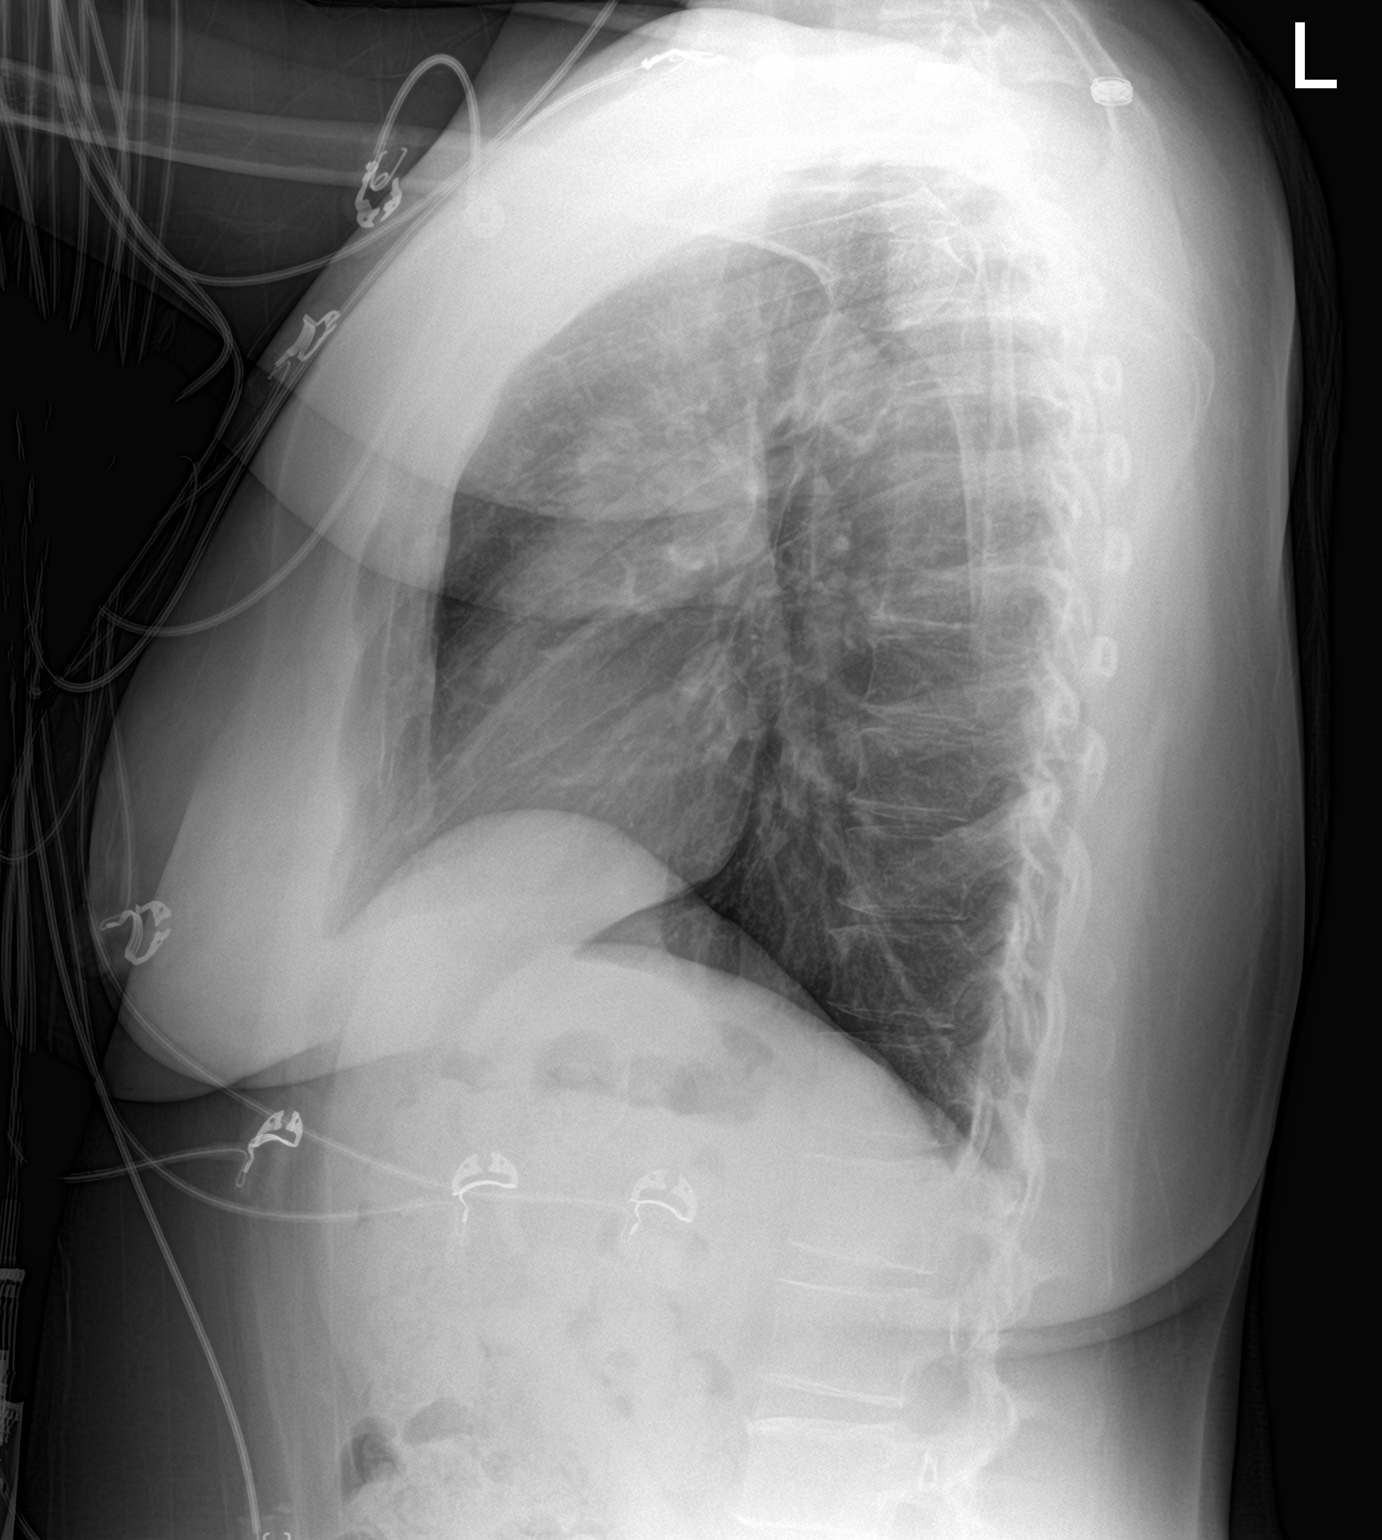

[2 of 2 positions shown; findings below may reference images not displayed]

FINDINGS: The cardiomediastinal contours are normal. The lungs are clear.
Pulmonary vasculature is normal. No consolidation, pleural effusion,
or pneumothorax. No acute osseous abnormalities are seen.
IMPRESSION: Negative radiographs of the chest.

## 2021-08-08 ENCOUNTER — Inpatient Hospital Stay: Payer: BC Managed Care – PPO | Admitting: Oncology

## 2021-08-08 ENCOUNTER — Inpatient Hospital Stay: Payer: BC Managed Care – PPO | Attending: Oncology

## 2021-08-08 ENCOUNTER — Encounter: Payer: Self-pay | Admitting: Oncology

## 2021-08-08 VITALS — BP 104/72 | HR 72 | Temp 97.3°F | Resp 18 | Wt 175.9 lb

## 2021-08-08 DIAGNOSIS — Z7901 Long term (current) use of anticoagulants: Secondary | ICD-10-CM | POA: Insufficient documentation

## 2021-08-08 DIAGNOSIS — D649 Anemia, unspecified: Secondary | ICD-10-CM | POA: Insufficient documentation

## 2021-08-08 DIAGNOSIS — Z86718 Personal history of other venous thrombosis and embolism: Secondary | ICD-10-CM | POA: Insufficient documentation

## 2021-08-08 DIAGNOSIS — I82462 Acute embolism and thrombosis of left calf muscular vein: Secondary | ICD-10-CM

## 2021-08-08 DIAGNOSIS — N921 Excessive and frequent menstruation with irregular cycle: Secondary | ICD-10-CM

## 2021-08-08 LAB — CBC WITH DIFFERENTIAL/PLATELET
Abs Immature Granulocytes: 0.02 10*3/uL (ref 0.00–0.07)
Basophils Absolute: 0 10*3/uL (ref 0.0–0.1)
Basophils Relative: 1 %
Eosinophils Absolute: 0.1 10*3/uL (ref 0.0–0.5)
Eosinophils Relative: 2 %
HCT: 43 % (ref 36.0–46.0)
Hemoglobin: 14.5 g/dL (ref 12.0–15.0)
Immature Granulocytes: 0 %
Lymphocytes Relative: 40 %
Lymphs Abs: 2.5 10*3/uL (ref 0.7–4.0)
MCH: 30.1 pg (ref 26.0–34.0)
MCHC: 33.7 g/dL (ref 30.0–36.0)
MCV: 89.2 fL (ref 80.0–100.0)
Monocytes Absolute: 0.4 10*3/uL (ref 0.1–1.0)
Monocytes Relative: 7 %
Neutro Abs: 3.1 10*3/uL (ref 1.7–7.7)
Neutrophils Relative %: 50 %
Platelets: 299 10*3/uL (ref 150–400)
RBC: 4.82 MIL/uL (ref 3.87–5.11)
RDW: 17.5 % — ABNORMAL HIGH (ref 11.5–15.5)
WBC: 6.1 10*3/uL (ref 4.0–10.5)
nRBC: 0 % (ref 0.0–0.2)

## 2021-08-08 LAB — COMPREHENSIVE METABOLIC PANEL
ALT: 17 U/L (ref 0–44)
AST: 20 U/L (ref 15–41)
Albumin: 4.4 g/dL (ref 3.5–5.0)
Alkaline Phosphatase: 85 U/L (ref 38–126)
Anion gap: 7 (ref 5–15)
BUN: 18 mg/dL (ref 6–20)
CO2: 25 mmol/L (ref 22–32)
Calcium: 9.4 mg/dL (ref 8.9–10.3)
Chloride: 107 mmol/L (ref 98–111)
Creatinine, Ser: 0.55 mg/dL (ref 0.44–1.00)
GFR, Estimated: >60 mL/min (ref >60)
Glucose, Bld: 89 mg/dL (ref 70–99)
Potassium: 3.8 mmol/L (ref 3.5–5.1)
Sodium: 139 mmol/L (ref 135–145)
Total Bilirubin: 0.3 mg/dL (ref 0.3–1.2)
Total Protein: 7.3 g/dL (ref 6.5–8.1)

## 2021-08-08 LAB — IRON AND TIBC
Iron: 92 ug/dL (ref 28–170)
Saturation Ratios: 26 % (ref 10.4–31.8)
TIBC: 354 ug/dL (ref 250–450)
UIBC: 262 ug/dL

## 2021-08-08 LAB — FERRITIN: Ferritin: 25 ng/mL (ref 11–307)

## 2021-08-08 NOTE — Progress Notes (Signed)
Hematology/Oncology Progress note [phone] [phone]         Patient Care Team: [name], MD as PCP - General (Family Medicine)  REFERRING PROVIDER: [name], MD  CHIEF COMPLAINTS/REASON FOR VISIT:   left lower extremity thrombus  HISTORY OF PRESENTING ILLNESS:   Erin Ramos is a  41 y.o.  female with PMH listed below was seen in consultation at the request of  [name], MD  for evaluation of acute left lower extremity deep vein thrombosis.  She recently had a prolonged trip flying back to [place]. Her trip was composed of a 40 minutes airflight followed by a 4 hour flight followed by 10 hours within the airport followed by 2 hours flying to [place] followed by 10 hours waiting the airport followed by her airflight back to [place].  She started to feel left lower extremity swelling/discomfort after she arrives [place].  She could not get to see her primary care provider and to 01/28/2021 and left lower extremity ultrasound was done on 01/29/2021 which showed popliteal vein and calf vein thrombosis.  Patient was started on Eliquis starting pack on 01/30/2021 and she starts Eliquis 5 mg twice daily today.  She tolerates anticoagulation.  No bleeding events. She was also taking birth control pill which was stopped on 01/29/2021.  Reports having withdrawal bleeding heavily. Some blood work done at primary care provider office which showed an hemoglobin 11.9.  She denies any prior history of thrombosis.  Her maternal aunt has a history of blood clot.  She denies any shortness of breath, hemoptysis.  Since her start of Eliquis, left lower extremity tenderness has improved.  She still has residual swelling.  INTERVAL HISTORY Erin Ramos is a 41 y.o. female who has above history reviewed by me today presents for follow up visit for management of acute left lower extremity DVT Patient has finished 3 months of Eliquis.  Currently on aspirin 162 mg  daily She reports feeling well.  No lower extremity swelling, bleeding, shortness of breath.   Review of Systems  Constitutional:  Negative for appetite change, chills, fatigue and fever.  HENT:   Negative for hearing loss and voice change.   Eyes:  Negative for eye problems.  Respiratory:  Negative for chest tightness and cough.   Cardiovascular:  Negative for chest pain.  Gastrointestinal:  Negative for abdominal distention, abdominal pain and blood in stool.  Endocrine: Negative for hot flashes.  Genitourinary:  Negative for difficulty urinating and frequency.   Musculoskeletal:  Negative for arthralgias.  Skin:  Negative for itching and rash.  Neurological:  Negative for extremity weakness.  Hematological:  Negative for adenopathy.  Psychiatric/Behavioral:  Negative for confusion.      MEDICAL HISTORY:  Past Medical History:  Diagnosis Date   Medical history non-contributory    Migraines     SURGICAL HISTORY: Past Surgical History:  Procedure Laterality Date   LAPAROSCOPIC OVARIAN CYSTECTOMY Right 10/27/2014   Procedure: LAPAROSCOPIC OVARIAN CYSTECTOMY, peritoneal biopsy;  Surgeon: Lorette Ang, MD;  Location: [facility] ORS;  Service: Gynecology;  Laterality: Right;   NO PAST SURGERIES      SOCIAL HISTORY: Social History   Socioeconomic History   Marital status: Married    Spouse name: Not on file   Number of children: Not on file   Years of education: Not on file   Highest education level: Not on file  Occupational History   Not on file  Tobacco Use   Smoking status:  Never   Smokeless tobacco: Never  Vaping Use   Vaping Use: Never used  Substance and Sexual Activity   Alcohol use: No   Drug use: No   Sexual activity: Not on file  Other Topics Concern   Not on file  Social History Narrative   Not on file   Social Determinants of Health   Financial Resource Strain: Not on file  Food Insecurity: Not on file  Transportation Needs: Not on file   Physical Activity: Not on file  Stress: Not on file  Social Connections: Not on file  Intimate Partner Violence: Not on file    FAMILY HISTORY: Family History  Problem Relation Age of Onset   Diabetes Mother    Clotting disorder Maternal Aunt     ALLERGIES:  is allergic to shellfish allergy.  MEDICATIONS:  Current Outpatient Medications  Medication Sig Dispense Refill   acyclovir cream (ZOVIRAX) 5 % Sparingly Topical 4 Times Daily     aspirin (ASPIRIN 81) 81 MG EC tablet Take 2 tablets (162 mg total) by mouth daily. Swallow whole. 60 tablet 2   baclofen (LIORESAL) 10 MG tablet Take 10 mg by mouth 3 (three) times daily. 1 Tablet(s) By Mouth 3 Times Daily     famotidine (PEPCID) 20 MG tablet Take 20 mg by mouth 2 (two) times daily. 1 Tablet(s) By Mouth Twice Daily     ferrous sulfate 325 (65 FE) MG EC tablet Take 1 tablet (325 mg total) by mouth 2 (two) times daily with a meal. 60 tablet 2   fexofenadine (ALLEGRA) 180 MG tablet Take by mouth. Take 180 mg by mouth daily     omeprazole (PRILOSEC) 40 MG capsule Take 1 capsule (40 mg total) by mouth 2 (two) times daily for 15 days. 30 capsule 0   rizatriptan (MAXALT) 10 MG tablet Take 1 tablet by mouth at ONSET of migraine or aura. May repeat in 2 HOURS if needed. DO NOT EXCEED 2 doses/day, 2 days/week, 8 doses/month.     sucralfate (CARAFATE) 1 g tablet      triamcinolone cream (KENALOG) 0.1 % Apply topically. Topical     EPINEPHrine 0.3 mg/0.3 mL IJ SOAJ injection Inject into the muscle. SMARTSIG:IM (Patient not taking: Reported on 02/06/2021)     fluticasone (FLONASE) 50 MCG/ACT nasal spray SHAKE LQ AND U 1 SPR IEN QAM (Patient not taking: Reported on 08/08/2021)  5   ondansetron (ZOFRAN-ODT) 4 MG disintegrating tablet Take by mouth. Take 1 tablet (4 mg total) by mouth every 8 (eight) hours as needed for Nausea May take TWO at a time IF NEEDED (Patient not taking: Reported on 02/06/2021)     No current facility-administered medications  for this visit.     PHYSICAL EXAMINATION: ECOG PERFORMANCE STATUS: 0 - Asymptomatic Vitals:   08/08/21 1025  BP: 104/72  Pulse: 72  Resp: 18  Temp: (!) 97.3 F (36.3 C)   Filed Weights   08/08/21 1025  Weight: 175 lb 14.4 oz (79.8 kg)    Physical Exam Constitutional:      General: She is not in acute distress. HENT:     Head: Normocephalic and atraumatic.  Eyes:     General: No scleral icterus. Cardiovascular:     Rate and Rhythm: Normal rate and regular rhythm.     Heart sounds: Normal heart sounds.  Pulmonary:     Effort: Pulmonary effort is normal. No respiratory distress.     Breath sounds: No wheezing.  Abdominal:  General: Bowel sounds are normal. There is no distension.     Palpations: Abdomen is soft.  Musculoskeletal:        General: No deformity. Normal range of motion.     Cervical back: Normal range of motion and neck supple.     Comments: Left calf tenderness has completely resolved. + Varicose vein  Skin:    General: Skin is warm and dry.     Findings: No erythema or rash.  Neurological:     Mental Status: She is alert and oriented to person, place, and time. Mental status is at baseline.     Cranial Nerves: No cranial nerve deficit.     Coordination: Coordination normal.  Psychiatric:        Mood and Affect: Mood normal.     LABORATORY DATA:  I have reviewed the data as listed Lab Results  Component Value Date   WBC 6.1 08/08/2021   HGB 14.5 08/08/2021   HCT 43.0 08/08/2021   MCV 89.2 08/08/2021   PLT 299 08/08/2021   Recent Labs    02/06/21 1148 05/09/21 1346 08/08/21 1005  NA 137 137 139  K 3.6 3.8 3.8  CL 105 109 107  CO2 '23 25 25  '$ GLUCOSE 128* 96 89  BUN '13 17 18  '$ CREATININE 0.44 0.57 0.55  CALCIUM 9.2 8.6* 9.4  GFRNONAA >60 >60 >60  PROT 7.5 7.2 7.3  ALBUMIN 3.8 3.9 4.4  AST '21 20 20  '$ ALT '13 15 17  '$ ALKPHOS 85 89 85  BILITOT 0.4 0.3 0.3    Iron/TIBC/Ferritin/ %Sat    Component Value Date/Time   IRON 92  08/08/2021 1005   TIBC 354 08/08/2021 1005   FERRITIN 25 08/08/2021 1005   IRONPCTSAT 26 08/08/2021 1005      RADIOGRAPHIC STUDIES: I have personally reviewed the radiological images as listed and agreed with the findings in the report. No results found.    ASSESSMENT & PLAN:  1. History of DVT (deep vein thrombosis)   2. Normocytic anemia    #Acute left lower extremity DVT, likely provoked due to immobilization due to travel as well as OCP. Hypercoagulable work-up was ordered.  If work-up is negative, recommend patient to discontinue aspirin 162 mg daily. In the future, if she expects any travel, recommend Aspirin 162 mg daily prophylactically.   Iron deficiency anemia, anemia has completely resolved.  Hemoglobin normal.  Iron panel is normal. Stop iron supplementation . All questions were answered. The patient knows to call the clinic with any problems questions or concerns.  cc [name], MD    Return of visit: No need for follow-up if hypercoagulable work-up is negative.  Earlie Server, MD, PhD [facility] Hematology Oncology 08/08/2021

## 2021-08-08 NOTE — Progress Notes (Signed)
Pt here for follow up. Pt reports that every time she has her menstrual cycle she has headaches.

## 2021-08-09 LAB — BETA-2-GLYCOPROTEIN I ABS, IGG/M/A
Beta-2 Glyco I IgG: <9 GPI IgG units (ref 0–20)
Beta-2-Glycoprotein I IgA: <9 GPI IgA units (ref 0–25)
Beta-2-Glycoprotein I IgM: <9 GPI IgM units (ref 0–32)

## 2021-08-10 LAB — ANTIPHOSPHOLIPID SYNDROME PROF
Anticardiolipin IgG: <9 GPL U/mL (ref 0–14)
Anticardiolipin IgM: <9 MPL U/mL (ref 0–12)
DRVVT: 34.1 s (ref 0.0–47.0)
PTT Lupus Anticoagulant: 29.1 s (ref 0.0–43.5)

## 2021-08-10 LAB — PROTEIN S, TOTAL AND FREE
Protein S Ag, Free: 96 % (ref 61–136)
Protein S Ag, Total: 106 % (ref 60–150)

## 2021-08-10 LAB — PROTEIN C ACTIVITY: Protein C Activity: 144 % (ref 73–180)

## 2021-08-13 LAB — ANTITHROMBIN III: AntiThromb III Func: 110 % (ref 75–120)

## 2021-08-20 ENCOUNTER — Telehealth: Payer: Self-pay

## 2021-08-20 NOTE — Telephone Encounter (Signed)
-----   Message from Earlie Server, MD sent at 08/16/2021  9:23 PM EDT ----- Please let patient know that her hypercoagulable work up is negative.  No need to follow up.

## 2021-08-20 NOTE — Telephone Encounter (Signed)
Patient informed of results and verbalized understanding. Also advised to stop aspirin, per MD recommendation.

## 2021-09-01 ENCOUNTER — Other Ambulatory Visit: Payer: Self-pay | Admitting: Oncology

## 2021-10-14 DIAGNOSIS — M545 Low back pain, unspecified: Secondary | ICD-10-CM | POA: Insufficient documentation

## 2021-10-14 DIAGNOSIS — M4316 Spondylolisthesis, lumbar region: Secondary | ICD-10-CM | POA: Insufficient documentation

## 2021-10-14 DIAGNOSIS — M5416 Radiculopathy, lumbar region: Secondary | ICD-10-CM | POA: Insufficient documentation

## 2021-10-14 DIAGNOSIS — S39012A Strain of muscle, fascia and tendon of lower back, initial encounter: Secondary | ICD-10-CM | POA: Insufficient documentation

## 2021-10-14 DIAGNOSIS — M533 Sacrococcygeal disorders, not elsewhere classified: Secondary | ICD-10-CM | POA: Insufficient documentation

## 2021-10-14 DIAGNOSIS — M47816 Spondylosis without myelopathy or radiculopathy, lumbar region: Secondary | ICD-10-CM | POA: Insufficient documentation

## 2022-03-20 ENCOUNTER — Other Ambulatory Visit: Payer: Self-pay | Admitting: Family Medicine

## 2022-03-20 DIAGNOSIS — R1011 Right upper quadrant pain: Secondary | ICD-10-CM

## 2022-03-27 ENCOUNTER — Ambulatory Visit
Admission: RE | Admit: 2022-03-27 | Discharge: 2022-03-27 | Disposition: A | Discharge status: Home / Self Care | Payer: BC Managed Care – PPO | Source: Ambulatory Visit | Attending: Family Medicine | Admitting: Family Medicine

## 2022-03-27 DIAGNOSIS — R1011 Right upper quadrant pain: Secondary | ICD-10-CM | POA: Diagnosis present

## 2022-04-14 ENCOUNTER — Encounter: Payer: Self-pay | Admitting: Surgery

## 2022-04-14 ENCOUNTER — Ambulatory Visit: Payer: BC Managed Care – PPO | Admitting: Surgery

## 2022-04-14 ENCOUNTER — Other Ambulatory Visit: Payer: Self-pay

## 2022-04-14 ENCOUNTER — Telehealth: Payer: Self-pay | Admitting: Surgery

## 2022-04-14 VITALS — BP 107/75 | HR 88 | Temp 98.4°F | Ht 61.0 in | Wt 183.0 lb

## 2022-04-14 DIAGNOSIS — K8020 Calculus of gallbladder without cholecystitis without obstruction: Secondary | ICD-10-CM

## 2022-04-14 NOTE — Progress Notes (Signed)
04/14/2022  Reason for Visit: Symptomatic cholelithiasis  Requesting Provider: [name], MD  History of Present Illness: Erin Ramos is a 42 y.o. female presenting for evaluation of symptomatic cholelithiasis.  The patient has a long history of cholelithiasis and has an ultrasound from 02/10/2014 which showed a 2.1 cm gallstone.  Initially the pain was less frequent but reports that the pain has been increasing in frequency.  More recently, she has been adjusting her diet and avoiding dairy foods or fatty foods.  She reports that her pain is better controlled but she is wondering about doing surgery.  She had a recent ultrasound on 03/27/2022 which still shows a large gallstone which now measures 2.3 cm.  I have personally viewed the images and agree with the findings.  The patient reports that when she does have episodes which is to be about weekly, she will have pain in the right upper quadrant with some bloatedness sensation and radiation of the pain to the back.  She also have associated nausea but no emesis.  She denies having any fevers or chills, chest pain or shortness of breath.  Of note, she does have a history of DVT in the left lower extremity last year due to a prolonged airplane trip.  She was on Eliquis and subsequently on aspirin but she has been off both medications for a year.  Past Medical History: Past Medical History:  Diagnosis Date   Acute deep vein thrombosis (DVT) of left lower extremity 01/2021   Migraines    Ovarian cyst      Past Surgical History: Past Surgical History:  Procedure Laterality Date   LAPAROSCOPIC OVARIAN CYSTECTOMY Right 10/27/2014   Procedure: LAPAROSCOPIC OVARIAN CYSTECTOMY, peritoneal biopsy;  Surgeon: Lorette Ang, MD;  Location: [facility] ORS;  Service: Gynecology;  Laterality: Right;    Home Medications: Prior to Admission medications   Medication Sig Start Date End Date Taking? Authorizing Provider  baclofen (LIORESAL) 10 MG tablet  Take 10 mg by mouth 3 (three) times daily. 1 Tablet(s) By Mouth 3 Times Daily 11/29/20   [provider]  EPINEPHrine 0.3 mg/0.3 mL IJ SOAJ injection Inject into the muscle. SMARTSIG:IM Patient not taking: Reported on 02/06/2021 11/19/20   [provider]  famotidine (PEPCID) 20 MG tablet Take 20 mg by mouth 2 (two) times daily. 1 Tablet(s) By Mouth Twice Daily 01/22/21   [provider]  fexofenadine (ALLEGRA) 180 MG tablet Take by mouth. Take 180 mg by mouth daily    [provider]  omeprazole (PRILOSEC) 40 MG capsule Take 1 capsule (40 mg total) by mouth 2 (two) times daily for 15 days. 08/06/17 08/08/21  Jonathon Bellows, MD  rizatriptan (MAXALT) 10 MG tablet Take 1 tablet by mouth at ONSET of migraine or aura. May repeat in 2 HOURS if needed. DO NOT EXCEED 2 doses/day, 2 days/week, 8 doses/month. 03/26/20   [provider]  triamcinolone cream (KENALOG) 0.1 % Apply topically. Topical 11/12/20   [provider]    Allergies: Allergies  Allergen Reactions   Shellfish Allergy Anaphylaxis and Other (See Comments)    Social History:  reports that she has never smoked. She has never used smokeless tobacco. She reports that she does not drink alcohol and does not use drugs.   Family History: Family History  Problem Relation Age of Onset   Diabetes Mother    Clotting disorder Maternal Aunt     Review of Systems: Review of Systems  Constitutional:  Negative for  chills and fever.  HENT:  Negative for hearing loss.   Respiratory:  Negative for shortness of breath.   Cardiovascular:  Negative for chest pain.  Gastrointestinal:  Positive for abdominal pain, constipation and nausea. Negative for diarrhea and vomiting.  Genitourinary:  Negative for dysuria.  Musculoskeletal:  Negative for myalgias.  Skin:  Negative for rash.  Neurological:  Negative for dizziness.  Psychiatric/Behavioral:  Negative for depression.     Physical Exam BP 107/75    Pulse 88   Temp 98.4 F (36.9 C) (Oral)   Ht 5\' 1"  (1.549 m)   Wt 183 lb (83 kg)   SpO2 99%   BMI 34.58 kg/m  CONSTITUTIONAL: No acute distress, well-nourished HEENT:  Normocephalic, atraumatic, extraocular motion intact. NECK: Trachea is midline, and there is no jugular venous distension.  RESPIRATORY:  Lungs are clear, and breath sounds are equal bilaterally. Normal respiratory effort without pathologic use of accessory muscles. CARDIOVASCULAR: Heart is regular without murmurs, gallops, or rubs. GI: The abdomen is soft, nondistended, with some discomfort in the epigastric and right upper quadrant areas.  Negative Murphy's sign. MUSCULOSKELETAL:  Normal muscle strength and tone in all four extremities.  No peripheral edema or cyanosis. SKIN: Skin turgor is normal. There are no pathologic skin lesions.  NEUROLOGIC:  Motor and sensation is grossly normal.  Cranial nerves are grossly intact. PSYCH:  Alert and oriented to person, place and time. Affect is normal.  Laboratory Analysis: Labs from 03/13/2022: Sodium 140, potassium 4.5, chloride 106, CO2 21, BUN 13, creatinine 0.63.  Total bilirubin less than 0.2, AST 17, ALT 12, alkaline phosphatase 91, albumin 4.8.  WBC 6.7, hemoglobin 12.3, hematocrit 37.3, platelets 298.  Imaging: Ultrasound abdomen on 03/27/2022: IMPRESSION: 1. Cholelithiasis without secondary signs of acute cholecystitis. 2. Increased hepatic parenchymal echogenicity suggestive of steatosis.  Assessment and Plan: This is a 42 y.o. female with symptomatic cholelithiasis.  - Discussed with the patient the findings on her ultrasound from 2016 and how it compares to the more recent one in 2024.  She still has a large gallstone now measuring 2.3 cm.  It is unclear if there is any more smaller stones or sludge.  But this can certainly be the etiology of her right upper quadrant intermittent pain.  Discussed with the patient that potentially if she is feeling better with a  low-fat diet or controlling her diet, that we could proceed with only conservative measures.  Unfortunately, this does not guarantee that she will not have further episodes and unfortunately does not remove the risk of acute cholecystitis.  Discussed with her that unfortunately there is no medication that would help with dissolving such a large stone and procedures to retrieve the stone would not be feasible given the size.  As such, if we were to do any procedures the recommendation would be for cholecystectomy.  She is in agreement with proceeding with surgical management. - Discussed with her then the plan for robotic assisted cholecystectomy and reviewed the surgery at length with her including the planned incisions, the risks of bleeding, infection, injury to surrounding structures, that this would be an outpatient procedure, the use of ICG to better evaluate the biliary anatomy, postoperative activity restrictions, pain control, and she is willing to proceed. - We will schedule the patient for surgery on 05/07/2022.  All of her questions have been answered.  I spent 55 minutes dedicated to the care of this patient on the date of this encounter to include pre-visit review of records,  face-to-face time with the patient discussing diagnosis and management, and any post-visit coordination of care.   Melvyn Neth, [facility]

## 2022-04-14 NOTE — Patient Instructions (Addendum)
Erin Ramos programadora de [place], [place], lo llamar dentro de 24 a 48 horas para programar su cita. Si no ha tenido noticias suyas despus de 48 horas, llame a nuestra oficina. Tenga la hoja azul disponible cuando llame para anotar informacin importante.  Si tiene alguna inquietud o pregunta, no dude en llamar a nuestra oficina.  Colecistectoma mnimamente invasiva Minimally Invasive Cholecystectomy Una colecistectoma mnimamente invasiva es una ciruga que se realiza para extirpar la vescula biliar. La vescula biliar es un rgano que tiene forma de pera y se encuentra debajo del hgado, del lado derecho del cuerpo. La vescula biliar almacena bilis, un lquido que ayuda al organismo a digerir las grasas. La colecistectoma se realiza con frecuencia para tratar la inflamacin (irritacin e hinchazn) de la vescula biliar (colecistitis). Por lo general, esta afeccin se debe a una acumulacin de clculos biliares (colelitiasis) en la vescula biliar o al estancamiento del lquido de la vescula biliar a causa de que los clculos biliares se atascan en los conductos (tubos) y obstruyen el paso de la bilis. Esto puede producir inflamacin y Social research officer, government. En los [employer], podr ser [place]. Este procedimiento se realiza a travs de pequeas incisiones en el abdomen, en lugar de una incisin grande. Tambin se denomina "ciruga laparoscpica". Se introduce un endoscopio delgado que tiene Public relations account executive (laparoscopio) a travs de una incisin. A travs de las otras incisiones, se introducen los instrumentos quirrgicos. En algunos casos, es posible que [place] mnimamente invasiva deba cambiarse a una [place] realizada a travs de una incisin ms grande. Esta se denomina "ciruga abierta". Informe al mdico acerca de lo siguiente: Cualquier alergia que tenga. Todos los [employer] [place], incluidos vitaminas, hierbas, gotas oftlmicas, cremas y medicamentos de venta  libre. Problemas previos que usted o algn miembro de su familia hayan tenido con los anestsicos. Cualquier problema de la sangre que tenga. Cirugas a las que se haya sometido. Cualquier afeccin mdica que tenga. Si est embarazada o podra estarlo. Cules son los riesgos? En general, se trata de un procedimiento seguro. Sin embargo, pueden ocurrir complicaciones, por ejemplo: Infeccin. Sangrado. Reacciones alrgicas a los medicamentos. Daos a las estructuras o los rganos cercanos. Un clculo biliar que queda en el conducto biliar comn. El conducto coldoco transporta la bilis desde la vescula biliar hasta el intestino delgado. Una filtracin de bilis del hgado o del conducto qustico despus de que se extirpa la vescula biliar. Qu ocurre antes del procedimiento? Cundo dejar de comer y beber Siga las instrucciones del mdico con respecto a lo que puede comer y beber antes del procedimiento. Pueden incluir: Ocho horas antes del procedimiento Deje de comer la mayora de los alimentos. No coma carne, alimentos fritos ni alimentos grasos. Consuma solo alimentos livianos, como tostadas o Soil scientist. Todos los lquidos son aceptables, excepto las bebidas energticas y el alcohol. Seis horas antes del procedimiento Deje de comer. Beba nicamente lquidos transparentes, como agua, jugo de fruta transparente, caf solo, t solo y bebidas deportivas. No consuma bebidas energticas ni alcohol. Dos horas antes del procedimiento Deje de beber todos los lquidos. Es posible que le permitan tomar medicamentos con pequeos sorbos de [place]. Si no sigue las instrucciones del mdico, el procedimiento puede retrasarse o cancelarse. Medicamentos Consulte al mdico si debe hacer o no lo siguiente: Quarry manager o suspender los medicamentos que [place] habitualmente. Esto es muy importante si toma medicamentos para la diabetes o anticoagulantes. Tomar medicamentos como aspirina e ibuprofeno. Estos  medicamentos pueden tener un  efecto anticoagulante en la sangre. No tome estos medicamentos a menos que el mdico se lo indique. Usar medicamentos de venta libre, vitaminas, hierbas y suplementos. Instrucciones generales Si va a marcharse a su casa inmediatamente despus del procedimiento, pdale a un adulto responsable que: Lo lleve a su casa desde el hospital o la clnica. No se le permitir conducir. Lo cuide durante el [employer] indiquen. No consuma ningn producto que contenga nicotina ni tabaco durante al [employer] las 4 semanas anteriores al procedimiento. Estos productos incluyen cigarrillos, tabaco para Higher education careers adviser y aparatos de vapeo, como los Psychologist, sport and exercise. Si necesita ayuda para dejar de fumar, consulte al [employer]. Pregntele al mdico: Cmo se Tax inspector de la Leisure centre manager. Qu medidas se tomarn para evitar una infeccin. Pueden incluir: Rasurar el vello del lugar de la ciruga. Lavar la piel con un jabn antisptico. Recibir antibiticos. Qu ocurre durante el procedimiento?  Le colocarn una va intravenosa (i.v.) en una vena. Le administrarn uno de los siguientes medicamentos o ambos: Un medicamento para ayudar a Nurse, children's (sedante). Un medicamento que lo har dormir (anestesia general). Su cirujano le har varias incisiones pequeas en el abdomen. El laparoscopio se introducir a travs de una de las pequeas incisiones. La cmara del laparoscopio enviar imgenes a un monitor que se encuentra en el quirfano. Esto permitir a su Adult nurse del abdomen. Le inyectarn un gas en el abdomen. Esto expandir el abdomen para que el cirujano tenga ms lugar para Chief of Staff. El resto del instrumental necesario para el procedimiento se introducir a travs de las otras incisiones. Se extirpar la vescula biliar a travs de una de las incisiones. Se puede examinar el conducto coldoco. Si se encuentran clculos en la va biliar, tal vez deban  extirparse. Despus de la extirpacin de la vescula biliar, se cerrarn las incisiones con puntos (suturas), grapas o goma para cerrar la piel. Las incisiones pueden cubrirse con una venda (vendaje). El procedimiento puede variar segn el mdico y el hospital. Sander Nephew ocurre despus del procedimiento? Le controlarn la presin arterial, la frecuencia cardaca, la frecuencia respiratoria y Retail buyer de oxgeno en la sangre hasta que le den el alta del hospital o la clnica. Le darn analgsicos para Financial controller, si es necesario. Es posible que le coloquen un drenaje en la incisin. Se lo retirarn [employer] despus del procedimiento. Resumen La colecistectoma mnimamente invasiva, tambin llamada colecistectoma laparoscpica, es una ciruga que se realiza para extirpar la vescula biliar a travs de pequeas incisiones. Informe a su mdico sobre todas las otras afecciones que tenga y [place] todos los medicamentos que est usando para dichas afecciones. Antes del procedimiento, siga las instrucciones sobre cundo dejar de comer y beber y Christene Slates cambiar o suspender medicamentos. Haga que un adulto responsable lo cuide durante el tiempo que le indiquen despus de que le den el alta del hospital o de la clnica. Esta informacin no tiene Marine scientist el consejo del mdico. Asegrese de hacerle al mdico cualquier pregunta que tenga. Document Revised: 07/18/2020 Document Reviewed: 07/18/2020 Elsevier Patient Education  [place].

## 2022-04-14 NOTE — Telephone Encounter (Signed)
Unable to leave a message, if patient calls back, please inform her of the following regarding scheduled surgery with Dr. Hampton Abbot.   Pre-Admission date/time, and Surgery date at [facility].  Surgery Date: 05/07/22 Preadmission Testing Date: 04/29/22 (phone 8a-1p)  Also patient will need to call at [phone], between 1-3:00pm the day before surgery, to find out what time to arrive for surgery.

## 2022-04-15 NOTE — Telephone Encounter (Signed)
Spoke with husband, and also information regarding surgery is also mailed via pink sheet.

## 2022-04-29 ENCOUNTER — Telehealth: Payer: Self-pay | Admitting: Surgery

## 2022-04-29 ENCOUNTER — Inpatient Hospital Stay: Admission: RE | Admit: 2022-04-29 | Payer: BC Managed Care – PPO | Source: Ambulatory Visit

## 2022-04-29 NOTE — Telephone Encounter (Signed)
Updated information regarding rescheduled surgery at patient's request.    Patient has been advised of Pre-Admission date/time, and Surgery date at [facility].  Surgery Date: 05/29/22 Preadmission Testing Date: 05/20/22 (phone 1p-4p)  Patient has been made aware to call [phone], between 1-3:00pm the day before surgery, to find out what time to arrive for surgery.

## 2022-05-20 ENCOUNTER — Other Ambulatory Visit: Payer: Self-pay

## 2022-05-20 ENCOUNTER — Encounter
Admission: RE | Admit: 2022-05-20 | Discharge: 2022-05-20 | Disposition: A | Discharge status: Home / Self Care | Payer: BC Managed Care – PPO | Source: Ambulatory Visit | Attending: Surgery | Admitting: Surgery

## 2022-05-20 VITALS — Ht 61.0 in | Wt 180.0 lb

## 2022-05-20 DIAGNOSIS — Z1152 Encounter for screening for COVID-19: Secondary | ICD-10-CM

## 2022-05-20 DIAGNOSIS — K8020 Calculus of gallbladder without cholecystitis without obstruction: Secondary | ICD-10-CM

## 2022-05-20 HISTORY — DX: Spondylosis without myelopathy or radiculopathy, lumbar region: M47.816

## 2022-05-20 HISTORY — DX: Gastro-esophageal reflux disease without esophagitis: K21.9

## 2022-05-20 HISTORY — DX: Calculus of gallbladder without cholecystitis without obstruction: K80.20

## 2022-05-20 HISTORY — DX: Anemia, unspecified: D64.9

## 2022-05-20 HISTORY — DX: Myalgia, other site: M79.18

## 2022-05-20 HISTORY — DX: Chronic tension-type headache, not intractable: G44.229

## 2022-05-20 HISTORY — DX: Migraine, unspecified, not intractable, without status migrainosus: G43.909

## 2022-05-20 NOTE — Patient Instructions (Addendum)
Your procedure is scheduled on   5/16/ 2024  Report to the Registration Desk on the 1st floor of the [facility]. To find out your arrival time, please call [phone] between 1PM - 3PM on: 5/15/ 2024  If your arrival time is 6:00 am, do not arrive before that time as the [facility] entrance doors do not open until 6:00 am.  REMEMBER: Instructions that are not followed completely may result in serious medical risk, up to and including death; or upon the discretion of your surgeon and anesthesiologist your surgery may need to be rescheduled.  Do not eat food after midnight the night before surgery.  No gum chewing or hard candies.   One week prior to surgery: Stop Anti-inflammatories (NSAIDS) such as Advil, Aleve, Ibuprofen, Motrin, Naproxen, Naprosyn and Aspirin based products such as Excedrin, Goody's Powder, BC Powder. Stop ANY OVER THE COUNTER supplements until after surgery. You may however, continue to take Tylenol if needed for pain up until the day of surgery.   Follow recommendations from Cardiologist or PCP regarding stopping blood thinners.  TAKE ONLY THESE MEDICATIONS THE MORNING OF SURGERY WITH A SIP OF WATER:  Prilosec (take one the night before and one on the morning of surgery - helps to prevent nausea after surgery.)  No Alcohol for 24 hours before or after surgery.  No Smoking including e-cigarettes for 24 hours before surgery.  No chewable tobacco products for at least 6 hours before surgery.  No nicotine patches on the day of surgery.  Do not use any "recreational" drugs for at least a week (preferably 2 weeks) before your surgery.  Please be advised that the combination of cocaine and anesthesia may have negative outcomes, up to and including death. If you test positive for cocaine, your surgery will be cancelled.  On the morning of surgery brush your teeth with toothpaste and water, you may rinse your mouth with mouthwash if you wish. Do not swallow  any toothpaste or mouthwash.  Use CHG Soap as directed on instruction sheet.  Do not wear jewelry, make-up, hairpins, clips or nail polish.  Do not wear lotions, powders, or perfumes.   Do not shave body hair from the neck down 48 hours before surgery.  Contact lenses, hearing aids and dentures may not be worn into surgery.  Do not bring valuables to the hospital. [facility] is not responsible for any missing/lost belongings or valuables.   Notify your doctor if there is any change in your medical condition (cold, fever, infection).  Wear comfortable clothing (specific to your surgery type) to the hospital.  After surgery, you can help prevent lung complications by doing breathing exercises.  Take deep breaths and cough every 1-2 hours. Your doctor may order a device called an Incentive Spirometer to help you take deep breaths. If you are being admitted to the hospital overnight, leave your suitcase in the car. After surgery it may be brought to your room.   If you are being discharged the day of surgery, you will not be allowed to drive home. You will need a responsible individual to drive you home and stay with you for 24 hours after surgery.    Please call the Pre-admissions Testing Dept. at [phone] if you have any questions about these instructions.  Surgery Visitation Policy:  Patients having surgery or a procedure may have two visitors.  Children under the age of 41 must have an adult with them who is not the patient.  Preparacin para la ciruga con jabn de GLUCONATO DE CLORHEXIDINA (CHG)  Jabn de gluconato de clorhexidina (CHG)  * Un limpiador antisptico que [employer] grmenes y se adhiere a la piel para seguir [employer] grmenes incluso despus del lavado.   *Se utiliza para ducharse la noche anterior a la [place] y la maana de la [place].  Antes de la [place], usted puede desempear un  papel importante al reducir la cantidad de grmenes en su piel. El jabn CHG (gluconato de clorhexidina) es un limpiador antisptico que mata los grmenes y se adhiere a la piel para continuar matndolos incluso despus del lavado.  No lo utilice si es alrgico al CHG o a los jabones antibacterianos. Si su piel se enrojece o irrita, deje de usar CHG.  1. Ducharse la NOCHE ANTES DE LA CIRUGA y la [place] DE LA CIRUGA con jabn CHG.  2. Si eliges lavarte el cabello, lvalo primero como de costumbre con tu champ habitual.  3. Despus del champ, enjuague bien el cabello y el cuerpo para eliminar el champ.  4. Utilice CHG como lo hara con cualquier otro jabn lquido. Puede aplicar CHG directamente sobre la piel y lavar suavemente con un pauelo o una toallita limpia.  5. Aplique el jabn CHG en su cuerpo nicamente desde el cuello hacia abajo. No utilizar en heridas abiertas o llagas abiertas. Evite el contacto con los ojos, odos, boca y genitales (partes privadas). Lvese la cara y los genitales (partes privadas) con su jabn habitual.  6. Lvese bien, prestando especial atencin al rea donde se realizar su ciruga.  7. Enjuague bien su cuerpo con agua tibia.  8. No se duche ni se lave con su jabn normal despus de usar y enjuagar el jabn CHG.  9. Squese dando palmaditas con una toalla limpia.  10. Use pijamas limpios para dormir la noche anterior a la ciruga.  12. Coloque sbanas limpias en su cama la noche de su primera ducha y no duerma con mascotas.  13. Ducharse nuevamente con el jabn CHG el da de la ciruga antes de llegar al hospital.  14. No aplique desodorantes, lociones o polvos.  15. Por favor use ropa limpia al hospital.

## 2022-05-22 IMAGING — US US EXTREM LOW VENOUS*L*
1 series · 13 of 24 positions shown · non-contrast
Comparison: None.

CLINICAL DATA: Left posterior calf pain.  Suspect varicose veins



[Series 1: us extrem low venous*left* · 0.07mm/px · 13 of 37 slices shown]
[im 1/37]
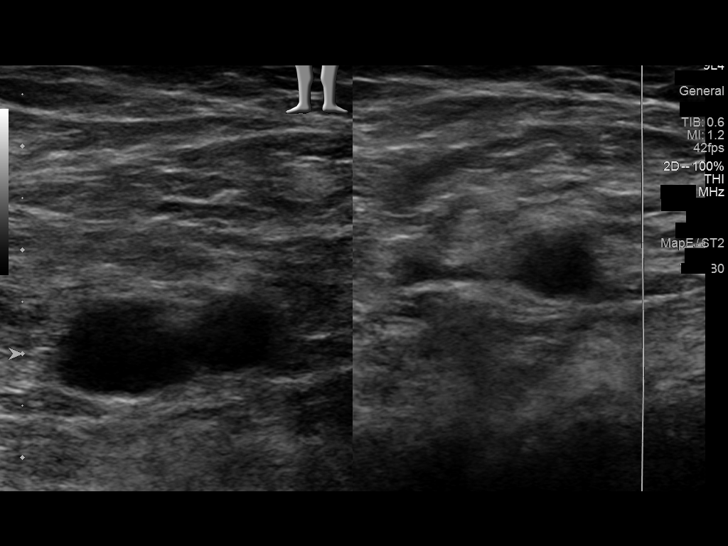
[im 4/37]
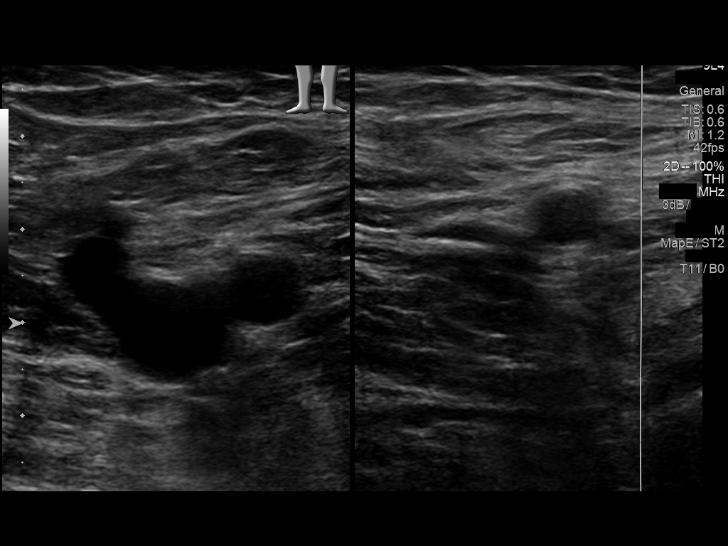
[im 7/37]
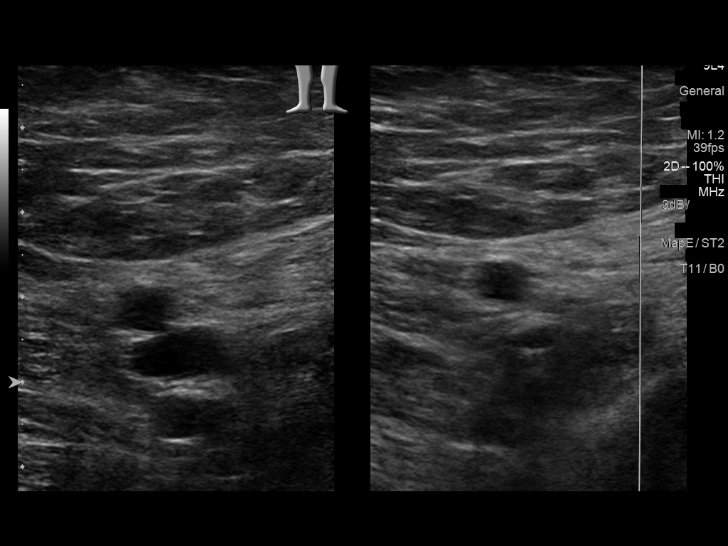
[im 10/37]
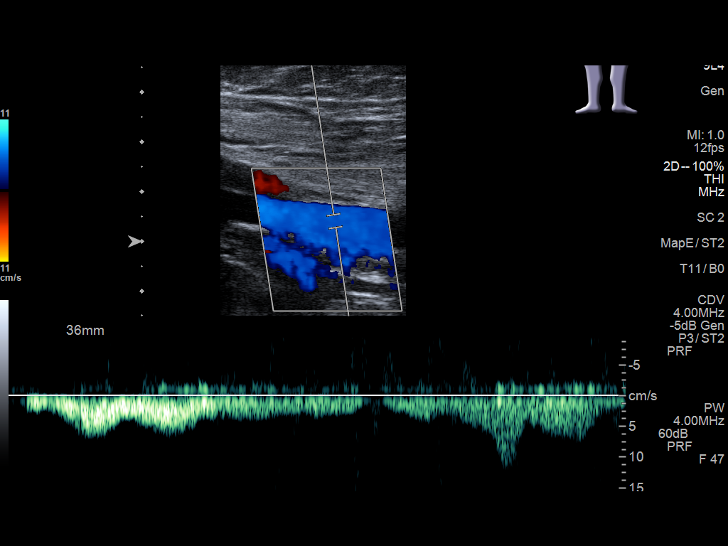
[im 13/37]
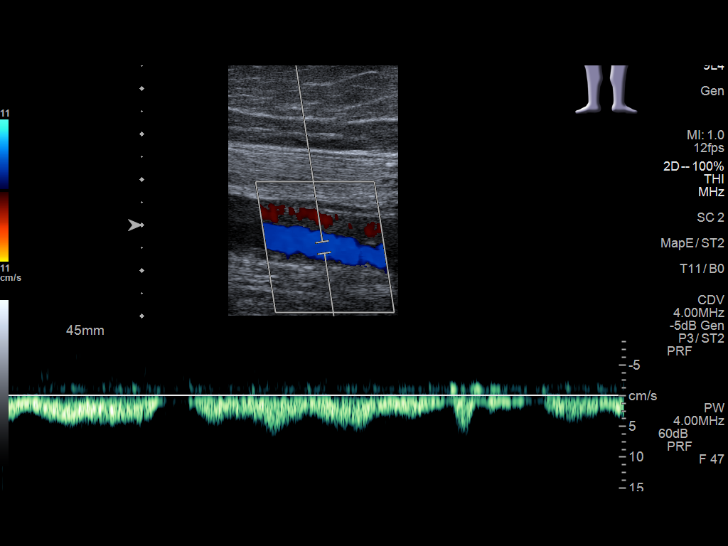
[im 16/37]
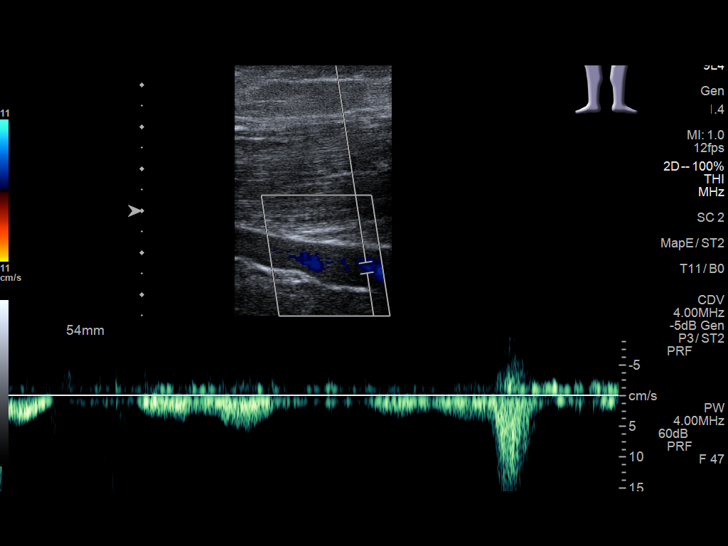
[im 19/37]
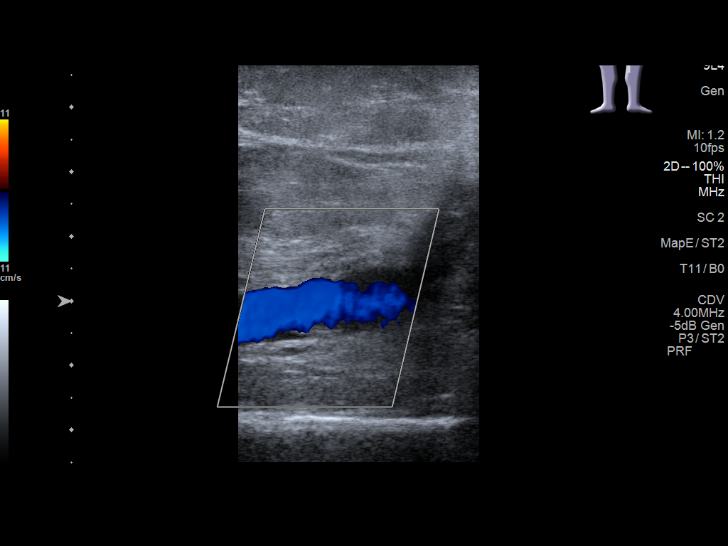
[im 21/37]
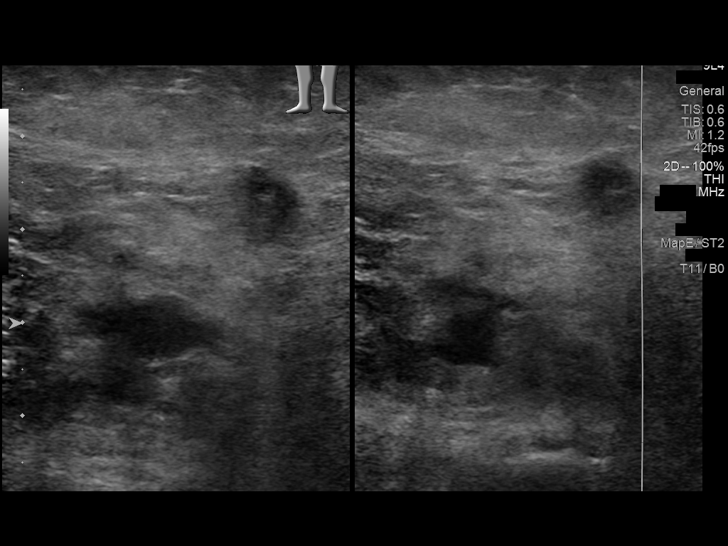
[im 24/37]
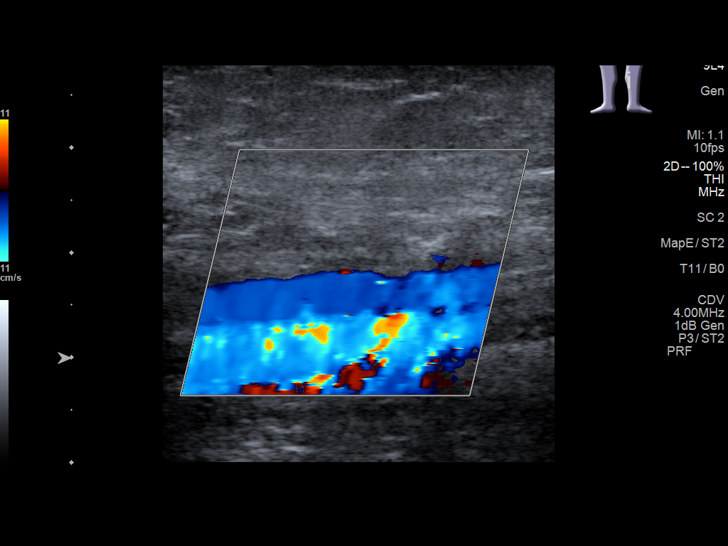
[im 27/37]
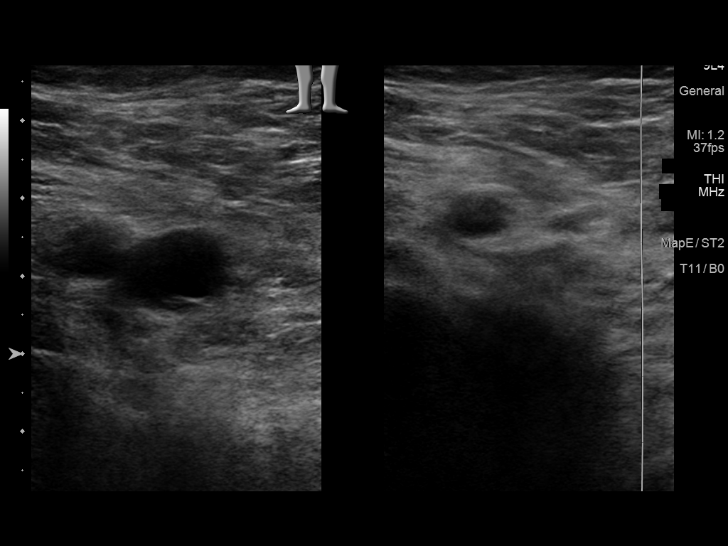
[im 30/37]
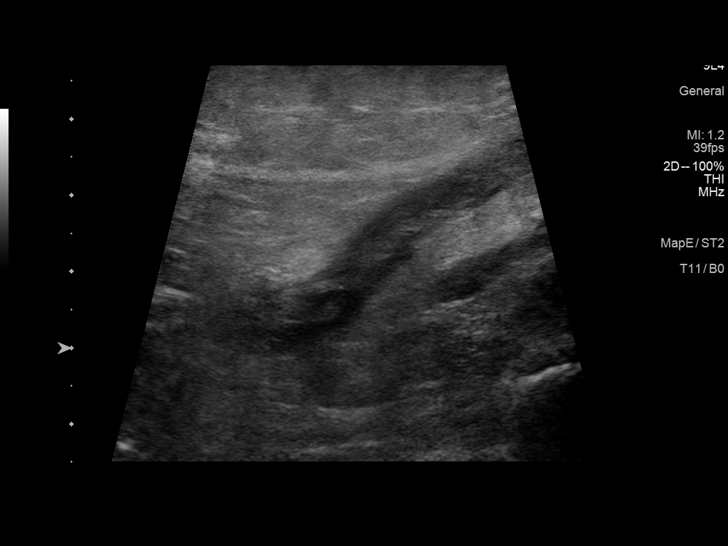
[im 33/37]
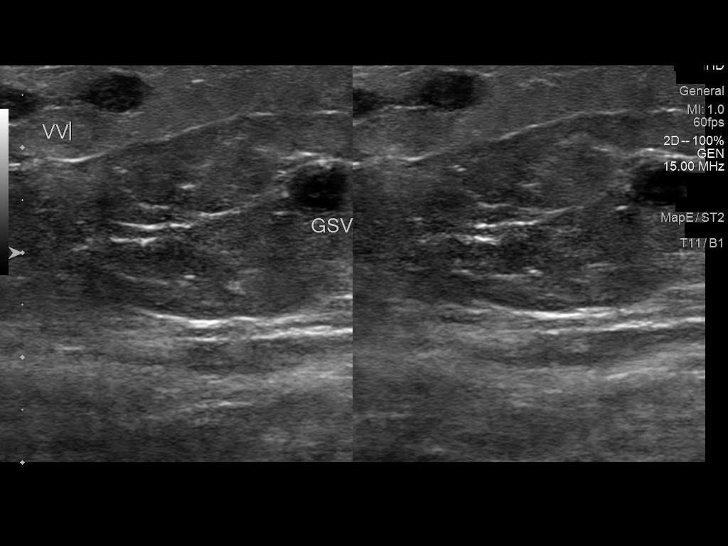
[im 37/37]
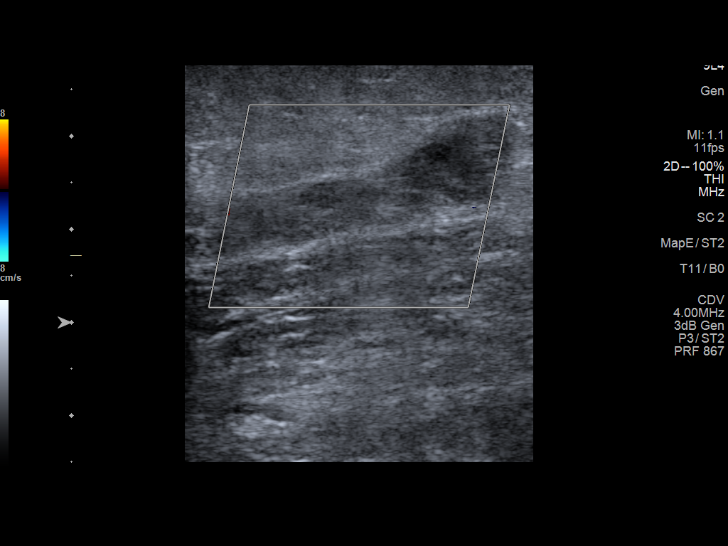

[13 of 24 positions shown; findings below may reference images not displayed]

FINDINGS: Contralateral Common Femoral Vein: Respiratory phasicity is normal
and symmetric with the symptomatic side. No evidence of thrombus.
Normal compressibility.

Common Femoral Vein: No evidence of thrombus. Normal
compressibility, respiratory phasicity and response to augmentation.

Saphenofemoral Junction: No evidence of thrombus. Normal
compressibility and flow on color Doppler imaging.

Profunda Femoral Vein: No evidence of thrombus. Normal
compressibility and flow on color Doppler imaging.

Femoral Vein: No evidence of thrombus. Normal compressibility,
respiratory phasicity and response to augmentation.

Popliteal Vein: Thrombus in the popliteal vein extending from left
lesser saphenous vein thrombus.

Calf Veins: Thrombus noted in calf veins.

Superficial Great Saphenous Vein: Superficial thrombus in the lesser
saphenous vein and greater saphenous vein. Multiple varicose veins
in calf veins.

Venous Reflux:  None.

Other Findings:  None.
IMPRESSION: Thrombus in the popliteal vein and calf veins.

Superficial thrombus lesser saphenous vein and greater saphenous
vein. Multiple calf vein varicosities.

## 2022-05-26 ENCOUNTER — Encounter: Payer: Self-pay | Admitting: Surgery

## 2022-05-26 ENCOUNTER — Ambulatory Visit: Payer: BC Managed Care – PPO | Admitting: Surgery

## 2022-05-26 VITALS — BP 128/80 | HR 79 | Temp 98.0°F | Ht 61.0 in | Wt 183.0 lb

## 2022-05-26 DIAGNOSIS — K8020 Calculus of gallbladder without cholecystitis without obstruction: Secondary | ICD-10-CM

## 2022-05-26 NOTE — Progress Notes (Signed)
05/26/2022  History of Present Illness: Erin Ramos is a 42 y.o. female presenting for follow up of symptomatic cholelithiasis.  She was originally scheduled for surgery on 04/29/2022 but patient rescheduled for this week on 05/29/2022.  Patient reports that she still having episodes of right upper quadrant pain a few times per week.  The pain sometimes associated with nausea.  Different foods may or may not cause issues at times.  Denies any worsening pain or episodes of emesis.  Past Medical History: Past Medical History:  Diagnosis Date   Acute deep vein thrombosis (DVT) of left lower extremity (HCC) 01/2021   Chronic tension type headache    GERD (gastroesophageal reflux disease)    Lumbar spondylosis    Migraine    Migraines    Myofascial pain syndrome, cervical    Normocytic anemia    Ovarian cyst    Symptomatic cholelithiasis      Past Surgical History: Past Surgical History:  Procedure Laterality Date   LAPAROSCOPIC OVARIAN CYSTECTOMY Right 10/27/2014   Procedure: LAPAROSCOPIC OVARIAN CYSTECTOMY, peritoneal biopsy;  Surgeon: Ala Dach, MD;  Location: [facility] ORS;  Service: Gynecology;  Laterality: Right;    Home Medications: Prior to Admission medications   Medication Sig Start Date End Date Taking? Authorizing Provider  baclofen (LIORESAL) 10 MG tablet Take 10 mg by mouth 3 (three) times daily. 1 Tablet(s) By Mouth 3 Times Daily 11/29/20  Yes [provider]  fexofenadine (ALLEGRA) 180 MG tablet Take 180 mg by mouth daily. Take 180 mg by mouth daily   Yes [provider]  naproxen (EC NAPROSYN) 500 MG EC tablet Take 500 mg by mouth as needed (pain).   Yes [provider]  rizatriptan (MAXALT) 10 MG tablet Take 1 tablet by mouth at ONSET of migraine or aura. May repeat in 2 HOURS if needed. DO NOT EXCEED 2 doses/day, 2 days/week, 8 doses/month. 03/26/20  Yes [provider]  omeprazole (PRILOSEC) 40 MG capsule Take 1 capsule (40 mg  total) by mouth 2 (two) times daily for 15 days. 08/06/17 05/20/22  Wyline Mood, MD    Allergies: Allergies  Allergen Reactions   Shellfish Allergy Anaphylaxis and Other (See Comments)    Review of Systems: Review of Systems  Constitutional:  Negative for chills and fever.  Respiratory:  Negative for shortness of breath.   Cardiovascular:  Negative for chest pain.  Gastrointestinal:  Positive for abdominal pain and nausea. Negative for vomiting.  Genitourinary:  Negative for dysuria.  Musculoskeletal:  Negative for myalgias.    Physical Exam BP 128/80   Pulse 79   Temp 98 F (36.7 C)   Ht 5\' 1"  (1.549 m)   Wt 183 lb (83 kg)   LMP 05/06/2022 (Exact Date)   SpO2 98%   BMI 34.58 kg/m  CONSTITUTIONAL: No acute distress HEENT:  Normocephalic, atraumatic, extraocular motion intact. RESPIRATORY:  Lungs are clear, and breath sounds are equal bilaterally. Normal respiratory effort without pathologic use of accessory muscles. CARDIOVASCULAR: Heart is regular without murmurs, gallops, or rubs. GI: The abdomen is soft, non-distended, with some discomfort in the RUQ and epigastric areas.  Negative Murphy's sign.  NEUROLOGIC:  Motor and sensation is grossly normal.  Cranial nerves are grossly intact. PSYCH:  Alert and oriented to person, place and time. Affect is normal.  Laboratory Analysis: Labs from 03/13/2022: Sodium 140, potassium 4.5, chloride 106, CO2 21, BUN 13, creatinine 0.63.  Total bilirubin less than 0.2, AST 17, ALT 12, alkaline phosphatase  91, albumin 4.8.  WBC 6.7, hemoglobin 12.3, hematocrit 37.3, platelets 298.   Imaging: Ultrasound abdomen on 03/27/2022: IMPRESSION: 1. Cholelithiasis without secondary signs of acute cholecystitis. 2. Increased hepatic parenchymal echogenicity suggestive of steatosis.  Assessment and Plan: This is a 42 y.o. female with symptomatic cholelithiasis.  - The patient currently is scheduled for robotic assisted cholecystectomy on  05/29/2022.  Discussed with the patient again the surgery at length including the planned incisions, the risks of bleeding, infection, injury to surrounding structures, that this would be an outpatient procedure, the use of ICG to better evaluate the biliary anatomy, postoperative activity restrictions, pain control, and she is willing to proceed. - All of her questions have been answered.  I spent 30 minutes dedicated to the care of this patient on the date of this encounter to include pre-visit review of records, face-to-face time with the patient discussing diagnosis and management, and any post-visit coordination of care.   Howie Ill, MD [facility]

## 2022-05-26 NOTE — Patient Instructions (Addendum)
You have requested to have your gallbladder removed. This will be done at [facility] with Dr. Aleen Campi on May 16th.  You will most likely be out of work 1-2 weeks for this surgery.  If you have FMLA or disability paperwork that needs filled out you may drop this off at our office or this can be faxed to [phone].  You will return after your post-op appointment with a lifting restriction for approximately 4 more weeks.  You will be able to eat anything you would like to following surgery. But, start by eating a bland diet and advance this as tolerated. The Gallbladder diet is below, please go as closely by this diet as possible prior to surgery to avoid any further attacks.  If you have any questions, please call our office.  Laparoscopic Cholecystectomy Laparoscopic cholecystectomy is surgery to remove the gallbladder. The gallbladder is located in the upper right part of the abdomen, behind the liver. It is a storage sac for bile, which is produced in the liver. Bile aids in the digestion and absorption of fats. Cholecystectomy is often done for inflammation of the gallbladder (cholecystitis). This condition is usually caused by a buildup of gallstones (cholelithiasis) in the gallbladder. Gallstones can block the flow of bile, and that can result in inflammation and pain. In severe cases, emergency surgery may be required. If emergency surgery is not required, you will have time to prepare for the procedure. Laparoscopic surgery is an alternative to open surgery. Laparoscopic surgery has a shorter recovery time. Your common bile duct may also need to be examined during the procedure. If stones are found in the common bile duct, they may be removed. LET [facility] CARE PROVIDER KNOW ABOUT: Any allergies you have. All medicines you are taking, including vitamins, herbs, eye drops, creams, and over-the-counter medicines. Previous problems you or members of your family have had with the  use of anesthetics. Any blood disorders you have. Previous surgeries you have had.  Any medical conditions you have. RISKS AND COMPLICATIONS Generally, this is a safe procedure. However, problems may occur, including: Infection. Bleeding. Allergic reactions to medicines. Damage to other structures or organs. A stone remaining in the common bile duct. A bile leak from the cyst duct that is clipped when your gallbladder is removed. The need to convert to open surgery, which requires a larger incision in the abdomen. This may be necessary if your surgeon thinks that it is not safe to continue with a laparoscopic procedure. BEFORE THE PROCEDURE Ask your health care provider about: Changing or stopping your regular medicines. This is especially important if you are taking diabetes medicines or blood thinners. Taking medicines such as aspirin and ibuprofen. These medicines can thin your blood. Do not take these medicines before your procedure if your health care provider instructs you not to. Follow instructions from your health care provider about eating or drinking restrictions. Let your health care provider know if you develop a cold or an infection before surgery. Plan to have someone take you home after the procedure. Ask your health care provider how your surgical site will be marked or identified. You may be given antibiotic medicine to help prevent infection. PROCEDURE To reduce your risk of infection: Your health care team will wash or sanitize their hands. Your skin will be washed with soap. An IV tube may be inserted into one of your veins. You will be given a medicine to make you fall asleep (general anesthetic). A breathing tube  will be placed in your mouth. The surgeon will make several small cuts (incisions) in your abdomen. A thin, lighted tube (laparoscope) that has a tiny camera on the end will be inserted through one of the small incisions. The camera on the laparoscope  will send a picture to a TV screen (monitor) in the operating room. This will give the surgeon a good view inside your abdomen. A gas will be pumped into your abdomen. This will expand your abdomen to give the surgeon more room to perform the surgery. Other tools that are needed for the procedure will be inserted through the other incisions. The gallbladder will be removed through one of the incisions. After your gallbladder has been removed, the incisions will be closed with stitches (sutures), staples, or skin glue. Your incisions may be covered with a bandage (dressing). The procedure may vary among health care providers and hospitals. AFTER THE PROCEDURE Your blood pressure, heart rate, breathing rate, and blood oxygen level will be monitored often until the medicines you were given have worn off. You will be given medicines as needed to control your pain.   This information is not intended to replace advice given to you by your health care provider. Make sure you discuss any questions you have with your health care provider.   Document Released: 12/30/2004 Document Revised: 09/20/2014 Document Reviewed: 08/11/2012 Elsevier Interactive Patient Education 2016 [employer] Inc[employer]   Low-Fat Diet for Gallbladder Conditions A low-fat diet can be helpful if you have pancreatitis or a gallbladder condition. With these conditions, your pancreas and gallbladder have trouble digesting fats. A healthy eating plan with less fat will help rest your pancreas and gallbladder and reduce your symptoms. WHAT DO I NEED TO KNOW ABOUT THIS DIET? Eat a low-fat diet. Reduce your fat intake to less than 20-30% of your total daily calories. This is less than 50-60 g of fat per day. Remember that you need some fat in your diet. Ask your dietician what your daily goal should be. Choose nonfat and low-fat healthy foods. Look for the words "nonfat," "low fat," or "fat free." As a guide, look on the label and choose foods  with less than 3 g of fat per serving. Eat only one serving. Avoid alcohol. Do not smoke. If you need help quitting, talk with your health care provider. Eat small frequent meals instead of three large heavy meals. WHAT FOODS CAN I EAT? Grains Include healthy grains and starches such as potatoes, wheat bread, fiber-rich cereal, and brown rice. Choose whole grain options whenever possible. In adults, whole grains should account for 45-65% of your daily calories.  Fruits and Vegetables Eat plenty of fruits and vegetables. Fresh fruits and vegetables add fiber to your diet. Meats and Other Protein Sources Eat lean meat such as chicken and pork. Trim any fat off of meat before cooking it. Eggs, fish, and beans are other sources of protein. In adults, these foods should account for 10-35% of your daily calories. Dairy Choose low-fat milk and dairy options. Dairy includes fat and protein, as well as calcium.  Fats and Oils Limit high-fat foods such as fried foods, sweets, baked goods, sugary drinks.  Other Creamy sauces and condiments, such as mayonnaise, can add extra fat. Think about whether or not you need to use them, or use smaller amounts or low fat options. WHAT FOODS ARE NOT RECOMMENDED? High fat foods, such as: [employer]. Ice cream. [place] toast. Sweet rolls. Pizza. Cheese bread. Foods covered with batter,  butter, creamy sauces, or cheese. Fried foods. Sugary drinks and desserts. Foods that cause gas or bloating   This information is not intended to replace advice given to you by your health care provider. Make sure you discuss any questions you have with your health care provider.   Document Released: 01/04/2013 Document Reviewed: 01/04/2013 Elsevier Interactive Patient Education [employer]

## 2022-05-26 NOTE — H&P (View-Only) (Signed)
05/26/2022  History of Present Illness: Erin Ramos is a 41 y.o. female presenting for follow up of symptomatic cholelithiasis.  She was originally scheduled for surgery on 04/29/2022 but patient rescheduled for this week on 05/29/2022.  Patient reports that she still having episodes of right upper quadrant pain a few times per week.  The pain sometimes associated with nausea.  Different foods may or may not cause issues at times.  Denies any worsening pain or episodes of emesis.  Past Medical History: Past Medical History:  Diagnosis Date   Acute deep vein thrombosis (DVT) of left lower extremity (HCC) 01/2021   Chronic tension type headache    GERD (gastroesophageal reflux disease)    Lumbar spondylosis    Migraine    Migraines    Myofascial pain syndrome, cervical    Normocytic anemia    Ovarian cyst    Symptomatic cholelithiasis      Past Surgical History: Past Surgical History:  Procedure Laterality Date   LAPAROSCOPIC OVARIAN CYSTECTOMY Right 10/27/2014   Procedure: LAPAROSCOPIC OVARIAN CYSTECTOMY, peritoneal biopsy;  Surgeon: Johanna K Halfon, MD;  Location: [facility] ORS;  Service: Gynecology;  Laterality: Right;    Home Medications: Prior to Admission medications   Medication Sig Start Date End Date Taking? Authorizing Provider  baclofen (LIORESAL) 10 MG tablet Take 10 mg by mouth 3 (three) times daily. 1 Tablet(s) By Mouth 3 Times Daily 11/29/20  Yes [provider]  fexofenadine (ALLEGRA) 180 MG tablet Take 180 mg by mouth daily. Take 180 mg by mouth daily   Yes [provider]  naproxen (EC NAPROSYN) 500 MG EC tablet Take 500 mg by mouth as needed (pain).   Yes [provider]  rizatriptan (MAXALT) 10 MG tablet Take 1 tablet by mouth at ONSET of migraine or aura. May repeat in 2 HOURS if needed. DO NOT EXCEED 2 doses/day, 2 days/week, 8 doses/month. 03/26/20  Yes [provider]  omeprazole (PRILOSEC) 40 MG capsule Take 1 capsule (40 mg  total) by mouth 2 (two) times daily for 15 days. 08/06/17 05/20/22  Anna, Kiran, MD    Allergies: Allergies  Allergen Reactions   Shellfish Allergy Anaphylaxis and Other (See Comments)    Review of Systems: Review of Systems  Constitutional:  Negative for chills and fever.  Respiratory:  Negative for shortness of breath.   Cardiovascular:  Negative for chest pain.  Gastrointestinal:  Positive for abdominal pain and nausea. Negative for vomiting.  Genitourinary:  Negative for dysuria.  Musculoskeletal:  Negative for myalgias.    Physical Exam BP 128/80   Pulse 79   Temp 98 F (36.7 C)   Ht 5' 1" (1.549 m)   Wt 183 lb (83 kg)   LMP 05/06/2022 (Exact Date)   SpO2 98%   BMI 34.58 kg/m  CONSTITUTIONAL: No acute distress HEENT:  Normocephalic, atraumatic, extraocular motion intact. RESPIRATORY:  Lungs are clear, and breath sounds are equal bilaterally. Normal respiratory effort without pathologic use of accessory muscles. CARDIOVASCULAR: Heart is regular without murmurs, gallops, or rubs. GI: The abdomen is soft, non-distended, with some discomfort in the RUQ and epigastric areas.  Negative Murphy's sign.  NEUROLOGIC:  Motor and sensation is grossly normal.  Cranial nerves are grossly intact. PSYCH:  Alert and oriented to person, place and time. Affect is normal.  Laboratory Analysis: Labs from 03/13/2022: Sodium 140, potassium 4.5, chloride 106, CO2 21, BUN 13, creatinine 0.63.  Total bilirubin less than 0.2, AST 17, ALT 12, alkaline phosphatase   91, albumin 4.8.  WBC 6.7, hemoglobin 12.3, hematocrit 37.3, platelets 298.   Imaging: Ultrasound abdomen on 03/27/2022: IMPRESSION: 1. Cholelithiasis without secondary signs of acute cholecystitis. 2. Increased hepatic parenchymal echogenicity suggestive of steatosis.  Assessment and Plan: This is a 41 y.o. female with symptomatic cholelithiasis.  - The patient currently is scheduled for robotic assisted cholecystectomy on  05/29/2022.  Discussed with the patient again the surgery at length including the planned incisions, the risks of bleeding, infection, injury to surrounding structures, that this would be an outpatient procedure, the use of ICG to better evaluate the biliary anatomy, postoperative activity restrictions, pain control, and she is willing to proceed. - All of her questions have been answered.  I spent 30 minutes dedicated to the care of this patient on the date of this encounter to include pre-visit review of records, face-to-face time with the patient discussing diagnosis and management, and any post-visit coordination of care.   Williette Loewe Luis Jamaine Quintin, MD [facility]     

## 2022-05-29 ENCOUNTER — Other Ambulatory Visit: Payer: Self-pay

## 2022-05-29 ENCOUNTER — Ambulatory Visit: Payer: BC Managed Care – PPO | Admitting: Certified Registered"

## 2022-05-29 ENCOUNTER — Encounter
Admission: RE | Disposition: A | Discharge status: Home / Self Care | Payer: Self-pay | Source: Home / Self Care | Attending: Surgery

## 2022-05-29 ENCOUNTER — Encounter: Payer: Self-pay | Admitting: Surgery

## 2022-05-29 ENCOUNTER — Ambulatory Visit
Admission: RE | Admit: 2022-05-29 | Discharge: 2022-05-29 | Disposition: A | Discharge status: Home / Self Care | Payer: BC Managed Care – PPO | Attending: Surgery | Admitting: Surgery

## 2022-05-29 ENCOUNTER — Ambulatory Visit: Payer: BC Managed Care – PPO | Admitting: Urgent Care

## 2022-05-29 DIAGNOSIS — G709 Myoneural disorder, unspecified: Secondary | ICD-10-CM | POA: Diagnosis not present

## 2022-05-29 DIAGNOSIS — K8010 Calculus of gallbladder with chronic cholecystitis without obstruction: Secondary | ICD-10-CM | POA: Insufficient documentation

## 2022-05-29 DIAGNOSIS — K8020 Calculus of gallbladder without cholecystitis without obstruction: Secondary | ICD-10-CM | POA: Diagnosis not present

## 2022-05-29 DIAGNOSIS — K219 Gastro-esophageal reflux disease without esophagitis: Secondary | ICD-10-CM | POA: Insufficient documentation

## 2022-05-29 DIAGNOSIS — Z86718 Personal history of other venous thrombosis and embolism: Secondary | ICD-10-CM | POA: Insufficient documentation

## 2022-05-29 DIAGNOSIS — Z1152 Encounter for screening for COVID-19: Secondary | ICD-10-CM

## 2022-05-29 LAB — POCT PREGNANCY, URINE: Preg Test, Ur: NEGATIVE

## 2022-05-29 SURGERY — CHOLECYSTECTOMY, ROBOT-ASSISTED, LAPAROSCOPIC
Anesthesia: General

## 2022-05-29 MED ORDER — ACETAMINOPHEN 500 MG PO TABS: 1000.0000 mg | ORAL_TABLET | Freq: Four times a day (QID) | ORAL | Status: AC | PRN

## 2022-05-29 MED ORDER — MIDAZOLAM HCL 2 MG/2ML IJ SOLN
INTRAMUSCULAR | Status: AC
  Filled 2022-05-29: qty 2

## 2022-05-29 MED ORDER — DROPERIDOL 2.5 MG/ML IJ SOLN: 0.6250 mg | Freq: Once | INTRAMUSCULAR | Status: DC | PRN

## 2022-05-29 MED ORDER — DEXAMETHASONE SODIUM PHOSPHATE 10 MG/ML IJ SOLN
INTRAMUSCULAR | Status: AC
  Filled 2022-05-29: qty 1

## 2022-05-29 MED ORDER — BUPIVACAINE HCL (PF) 0.25 % IJ SOLN
INTRAMUSCULAR | Status: AC
  Filled 2022-05-29: qty 30

## 2022-05-29 MED ORDER — PROPOFOL 10 MG/ML IV BOLUS
INTRAVENOUS | Status: AC
  Filled 2022-05-29: qty 20

## 2022-05-29 MED ORDER — ONDANSETRON HCL 4 MG/2ML IJ SOLN
INTRAMUSCULAR | Status: AC
  Filled 2022-05-29: qty 2

## 2022-05-29 MED ORDER — ACETAMINOPHEN 500 MG PO TABS
ORAL_TABLET | ORAL | Status: AC
  Filled 2022-05-29: qty 2

## 2022-05-29 MED ORDER — OXYCODONE HCL 5 MG PO TABS
5.0000 mg | ORAL_TABLET | Freq: Once | ORAL | Status: AC | PRN
  Administered 2022-05-29: 5 mg via ORAL

## 2022-05-29 MED ORDER — OXYCODONE HCL 5 MG PO TABS
ORAL_TABLET | ORAL | Status: AC
  Filled 2022-05-29: qty 1

## 2022-05-29 MED ORDER — FENTANYL CITRATE (PF) 100 MCG/2ML IJ SOLN
INTRAMUSCULAR | Status: AC
  Filled 2022-05-29: qty 2

## 2022-05-29 MED ORDER — EPINEPHRINE PF 1 MG/ML IJ SOLN
INTRAMUSCULAR | Status: AC
  Filled 2022-05-29: qty 1

## 2022-05-29 MED ORDER — ACETAMINOPHEN 500 MG PO TABS
1000.0000 mg | ORAL_TABLET | ORAL | Status: AC
  Administered 2022-05-29: 1000 mg via ORAL

## 2022-05-29 MED ORDER — HYDROMORPHONE HCL 1 MG/ML IJ SOLN
INTRAMUSCULAR | Status: AC
  Filled 2022-05-29: qty 1

## 2022-05-29 MED ORDER — EPHEDRINE SULFATE (PRESSORS) 50 MG/ML IJ SOLN
INTRAMUSCULAR | Status: DC | PRN
  Administered 2022-05-29: 10 mg via INTRAVENOUS

## 2022-05-29 MED ORDER — CEFAZOLIN SODIUM-DEXTROSE 2-4 GM/100ML-% IV SOLN
2.0000 g | INTRAVENOUS | Status: AC
  Administered 2022-05-29: 2 g via INTRAVENOUS

## 2022-05-29 MED ORDER — CHLORHEXIDINE GLUCONATE CLOTH 2 % EX PADS: 6.0000 | MEDICATED_PAD | Freq: Once | CUTANEOUS | Status: DC

## 2022-05-29 MED ORDER — HYDROMORPHONE HCL 1 MG/ML IJ SOLN
INTRAMUSCULAR | Status: DC | PRN
  Administered 2022-05-29 (×2): .5 mg via INTRAVENOUS

## 2022-05-29 MED ORDER — CHLORHEXIDINE GLUCONATE 0.12 % MT SOLN
OROMUCOSAL | Status: AC
  Filled 2022-05-29: qty 15

## 2022-05-29 MED ORDER — INDOCYANINE GREEN 25 MG IV SOLR
2.5000 mg | INTRAVENOUS | Status: AC
  Administered 2022-05-29: 2.5 mg via INTRAVENOUS
  Filled 2022-05-29: qty 1

## 2022-05-29 MED ORDER — CEFAZOLIN SODIUM-DEXTROSE 2-4 GM/100ML-% IV SOLN
INTRAVENOUS | Status: AC
  Filled 2022-05-29: qty 100

## 2022-05-29 MED ORDER — FENTANYL CITRATE (PF) 100 MCG/2ML IJ SOLN
INTRAMUSCULAR | Status: DC | PRN
  Administered 2022-05-29 (×2): 50 ug via INTRAVENOUS

## 2022-05-29 MED ORDER — GABAPENTIN 300 MG PO CAPS
300.0000 mg | ORAL_CAPSULE | ORAL | Status: AC
  Administered 2022-05-29: 300 mg via ORAL

## 2022-05-29 MED ORDER — KETOROLAC TROMETHAMINE 30 MG/ML IJ SOLN
INTRAMUSCULAR | Status: AC
  Filled 2022-05-29: qty 1

## 2022-05-29 MED ORDER — DEXAMETHASONE SODIUM PHOSPHATE 10 MG/ML IJ SOLN
INTRAMUSCULAR | Status: DC | PRN
  Administered 2022-05-29: 5 mg via INTRAVENOUS

## 2022-05-29 MED ORDER — OXYCODONE HCL 5 MG/5ML PO SOLN: 5.0000 mg | Freq: Once | ORAL | Status: AC | PRN

## 2022-05-29 MED ORDER — GABAPENTIN 300 MG PO CAPS
ORAL_CAPSULE | ORAL | Status: AC
  Filled 2022-05-29: qty 1

## 2022-05-29 MED ORDER — BUPIVACAINE-EPINEPHRINE (PF) 0.25% -1:200000 IJ SOLN
INTRAMUSCULAR | Status: DC | PRN
  Administered 2022-05-29: 30 mL

## 2022-05-29 MED ORDER — ONDANSETRON HCL 4 MG/2ML IJ SOLN
INTRAMUSCULAR | Status: DC | PRN
  Administered 2022-05-29: 4 mg via INTRAVENOUS

## 2022-05-29 MED ORDER — PROMETHAZINE HCL 25 MG/ML IJ SOLN: 6.2500 mg | INTRAMUSCULAR | Status: DC | PRN

## 2022-05-29 MED ORDER — OXYCODONE HCL 5 MG PO TABS
5.0000 mg | ORAL_TABLET | ORAL | 0 refills | Status: DC | PRN
Start: 2022-05-29 — End: 2022-06-12

## 2022-05-29 MED ORDER — LACTATED RINGERS IV SOLN: INTRAVENOUS | Status: DC

## 2022-05-29 MED ORDER — KETOROLAC TROMETHAMINE 30 MG/ML IJ SOLN
INTRAMUSCULAR | Status: DC | PRN
  Administered 2022-05-29: 30 mg via INTRAVENOUS

## 2022-05-29 MED ORDER — MIDAZOLAM HCL 2 MG/2ML IJ SOLN
INTRAMUSCULAR | Status: DC | PRN
  Administered 2022-05-29: 2 mg via INTRAVENOUS

## 2022-05-29 MED ORDER — FENTANYL CITRATE (PF) 100 MCG/2ML IJ SOLN
25.0000 ug | INTRAMUSCULAR | Status: DC | PRN
  Administered 2022-05-29 (×2): 25 ug via INTRAVENOUS

## 2022-05-29 MED ORDER — ROCURONIUM BROMIDE 100 MG/10ML IV SOLN
INTRAVENOUS | Status: DC | PRN
  Administered 2022-05-29: 30 mg via INTRAVENOUS
  Administered 2022-05-29: 50 mg via INTRAVENOUS

## 2022-05-29 MED ORDER — CHLORHEXIDINE GLUCONATE 0.12 % MT SOLN
15.0000 mL | Freq: Once | OROMUCOSAL | Status: AC
  Administered 2022-05-29: 15 mL via OROMUCOSAL

## 2022-05-29 MED ORDER — IBUPROFEN 800 MG PO TABS: 800.0000 mg | ORAL_TABLET | Freq: Three times a day (TID) | ORAL | 1 refills | Status: DC | PRN

## 2022-05-29 MED ORDER — ORAL CARE MOUTH RINSE: 15.0000 mL | Freq: Once | OROMUCOSAL | Status: AC

## 2022-05-29 MED ORDER — LIDOCAINE HCL (CARDIAC) PF 100 MG/5ML IV SOSY
PREFILLED_SYRINGE | INTRAVENOUS | Status: DC | PRN
  Administered 2022-05-29: 60 mg via INTRAVENOUS

## 2022-05-29 MED ORDER — SUGAMMADEX SODIUM 200 MG/2ML IV SOLN
INTRAVENOUS | Status: DC | PRN
  Administered 2022-05-29: 200 mg via INTRAVENOUS

## 2022-05-29 MED ORDER — ACETAMINOPHEN 10 MG/ML IV SOLN: 1000.0000 mg | Freq: Once | INTRAVENOUS | Status: DC | PRN

## 2022-05-29 MED ORDER — EPHEDRINE 5 MG/ML INJ
INTRAVENOUS | Status: AC
  Filled 2022-05-29: qty 5

## 2022-05-29 MED ORDER — PROPOFOL 10 MG/ML IV BOLUS
INTRAVENOUS | Status: DC | PRN
  Administered 2022-05-29: 150 mg via INTRAVENOUS

## 2022-05-29 MED ORDER — CHLORHEXIDINE GLUCONATE CLOTH 2 % EX PADS
6.0000 | MEDICATED_PAD | Freq: Once | CUTANEOUS | Status: AC
  Administered 2022-05-29: 6 via TOPICAL

## 2022-05-29 SURGICAL SUPPLY — 52 items
ADH SKN CLS APL DERMABOND .7 (GAUZE/BANDAGES/DRESSINGS) ×1
BAG PRESSURE INF REUSE 1000 (BAG) IMPLANT
CANNULA CAP OBTURATR AIRSEAL 8 (CAP) IMPLANT
CAUTERY HOOK MNPLR 1.6 DVNC XI (INSTRUMENTS) ×1 IMPLANT
CLIP LIGATING HEMO O LOK GREEN (MISCELLANEOUS) ×1 IMPLANT
DERMABOND ADVANCED .7 DNX12 (GAUZE/BANDAGES/DRESSINGS) ×1 IMPLANT
DRAPE ARM DVNC X/XI (DISPOSABLE) ×4 IMPLANT
DRAPE COLUMN DVNC XI (DISPOSABLE) ×1 IMPLANT
ELECT CAUTERY BLADE TIP 2.5 (TIP) ×1
ELECT REM PT RETURN 9FT ADLT (ELECTROSURGICAL) ×1
ELECTRODE CAUTERY BLDE TIP 2.5 (TIP) ×1 IMPLANT
ELECTRODE REM PT RTRN 9FT ADLT (ELECTROSURGICAL) ×1 IMPLANT
FORCEPS BPLR R/ABLATION 8 DVNC (INSTRUMENTS) ×1 IMPLANT
FORCEPS PROGRASP DVNC XI (FORCEP) ×1 IMPLANT
GLOVE SURG SYN 7.0 (GLOVE) ×3 IMPLANT
GLOVE SURG SYN 7.0 PF PI (GLOVE) ×2 IMPLANT
GLOVE SURG SYN 7.5  E (GLOVE) ×3
GLOVE SURG SYN 7.5 E (GLOVE) ×3 IMPLANT
GLOVE SURG SYN 7.5 PF PI (GLOVE) ×2 IMPLANT
GOWN STRL REUS W/ TWL LRG LVL3 (GOWN DISPOSABLE) ×4 IMPLANT
GOWN STRL REUS W/TWL LRG LVL3 (GOWN DISPOSABLE) ×3
IRRIGATION STRYKERFLOW (MISCELLANEOUS) IMPLANT
IRRIGATOR STRYKERFLOW (MISCELLANEOUS) ×1
IRRIGATOR SUCT 8 DISP DVNC XI (IRRIGATION / IRRIGATOR) IMPLANT
IV NS 1000ML (IV SOLUTION)
IV NS 1000ML BAXH (IV SOLUTION) IMPLANT
KIT PINK PAD W/HEAD ARE REST (MISCELLANEOUS) ×1
KIT PINK PAD W/HEAD ARM REST (MISCELLANEOUS) ×1 IMPLANT
LABEL OR SOLS (LABEL) ×1 IMPLANT
MANIFOLD NEPTUNE II (INSTRUMENTS) ×1 IMPLANT
NDL HYPO 22X1.5 SAFETY MO (MISCELLANEOUS) ×1 IMPLANT
NEEDLE HYPO 22X1.5 SAFETY MO (MISCELLANEOUS) ×1 IMPLANT
NS IRRIG 500ML POUR BTL (IV SOLUTION) ×1 IMPLANT
OBTURATOR OPTICAL STND 8 DVNC (TROCAR) ×1
OBTURATOR OPTICALSTD 8 DVNC (TROCAR) ×1 IMPLANT
PACK LAP CHOLECYSTECTOMY (MISCELLANEOUS) ×1 IMPLANT
PENCIL SMOKE EVACUATOR (MISCELLANEOUS) ×1 IMPLANT
SEAL UNIV 5-12 XI (MISCELLANEOUS) ×4 IMPLANT
SET TUBE FILTERED XL AIRSEAL (SET/KITS/TRAYS/PACK) IMPLANT
SET TUBE SMOKE EVAC HIGH FLOW (TUBING) ×1 IMPLANT
SOL ELECTROSURG ANTI STICK (MISCELLANEOUS) ×1
SOLUTION ELECTROSURG ANTI STCK (MISCELLANEOUS) ×1 IMPLANT
SPIKE FLUID TRANSFER (MISCELLANEOUS) ×1 IMPLANT
SPONGE T-LAP 18X18 ~~LOC~~+RFID (SPONGE) IMPLANT
SPONGE T-LAP 4X18 ~~LOC~~+RFID (SPONGE) ×1 IMPLANT
SUT MNCRL AB 4-0 PS2 18 (SUTURE) ×1 IMPLANT
SUT VIC AB 3-0 SH 27 (SUTURE) ×1
SUT VIC AB 3-0 SH 27X BRD (SUTURE) IMPLANT
SUT VICRYL 0 UR6 27IN ABS (SUTURE) ×2 IMPLANT
SYS BAG RETRIEVAL 10MM (BASKET) ×1
SYSTEM BAG RETRIEVAL 10MM (BASKET) ×1 IMPLANT
WATER STERILE IRR 500ML POUR (IV SOLUTION) ×1 IMPLANT

## 2022-05-29 NOTE — Op Note (Signed)
  Procedure Date:  05/29/2022  Pre-operative Diagnosis:  Symptomatic cholelithiasis  Post-operative Diagnosis: Symptomatic cholelithiasis  Procedure:  Robotic assisted cholecystectomy with ICG FireFly cholangiogram  Surgeon:  Howie Ill, MD  Anesthesia:  General endotracheal  Estimated Blood Loss:  20 ml  Specimens:  gallbladder  Complications:  None  Indications for Procedure:  This is a 42 y.o. female who presents with abdominal pain and workup revealing symptomatic cholelithiasis.  The benefits, complications, treatment options, and expected outcomes were discussed with the patient. The risks of bleeding, infection, recurrence of symptoms, failure to resolve symptoms, bile duct damage, bile duct leak, retained common bile duct stone, bowel injury, and need for further procedures were all discussed with the patient and she was willing to proceed.  Patient had injection of ICG in preop via IV catheter.  Description of Procedure: The patient was correctly identified in the preoperative area and brought into the operating room.  The patient was placed supine with VTE prophylaxis in place.  Appropriate time-outs were performed.  Anesthesia was induced and the patient was intubated.  Appropriate antibiotics were infused.  The abdomen was prepped and draped in a sterile fashion. An infraumbilical incision was made. A cutdown technique was used to enter the abdominal cavity without injury, and a 12 mm robotic port was inserted.  Pneumoperitoneum was obtained with appropriate opening pressures.  Three 8-mm ports were placed in the mid abdomen at the level of the umbilicus under direct visualization.  The DaVinci platform was docked, camera targeted, and instruments were placed under direct visualization.  The gallbladder was identified.  The fundus was grasped and retracted cephalad.  Adhesions were lysed bluntly and with electrocautery. The infundibulum was grasped and retracted laterally,  exposing the peritoneum overlying the gallbladder.  This was incised with electrocautery and extended on either side of the gallbladder.  FireFly cholangiogram was then obtained, and we were able to clearly identify the cystic duct and common bile duct.  There was flow of ICG from liver to common hepatic duct, cystic duct, gallbladder, common bile duct, and into the duodenum, showing no gross obstruction.  The cystic duct and cystic artery were carefully dissected with combination of cautery and blunt dissection.  Both were clipped twice proximally and once distally, cutting in between.  The gallbladder was taken from the gallbladder fossa in a retrograde fashion with electrocautery. The gallbladder was placed in an Endocatch bag. The liver bed was inspected and any bleeding was controlled with electrocautery. The right upper quadrant was then inspected again revealing intact clips, no bleeding, and no ductal injury.  The area was thoroughly irrigated.  The 8 mm ports were removed under direct visualization and the 12 mm port was removed.  The Endocatch bag was brought out via the umbilical incision. The fascial opening was closed using 0 vicryl suture.  Local anesthetic was infused in all incisions and the incisions were closed with 4-0 Monocryl.  The wounds were cleaned and sealed with DermaBond.  The patient was emerged from anesthesia and extubated and brought to the recovery room for further management.  The patient tolerated the procedure well and all counts were correct at the end of the case.   Howie Ill, MD

## 2022-05-29 NOTE — Interval H&P Note (Signed)
History and Physical Interval Note:  05/29/2022 7:12 AM  Erin Ramos  has presented today for surgery, with the diagnosis of symptomatic cholelithiasis.  The various methods of treatment have been discussed with the patient and family. After consideration of risks, benefits and other options for treatment, the patient has consented to  Procedure(s): XI ROBOTIC ASSISTED LAPAROSCOPIC CHOLECYSTECTOMY (N/A) INDOCYANINE GREEN FLUORESCENCE IMAGING (ICG) (N/A) as a surgical intervention.  The patient's history has been reviewed, patient examined, no change in status, stable for surgery.  I have reviewed the patient's chart and labs.  Questions were answered to the patient's satisfaction.     Jeremia Groot

## 2022-05-29 NOTE — Anesthesia Preprocedure Evaluation (Addendum)
Anesthesia Evaluation  Patient identified by MRN, date of birth, ID band Patient awake    Reviewed: Allergy & Precautions, H&P , NPO status , Patient's Chart, lab work & pertinent test results  Airway Mallampati: II  TM Distance: >3 FB Neck ROM: full    Dental no notable dental hx.    Pulmonary neg pulmonary ROS   Pulmonary exam normal        Cardiovascular negative cardio ROS Normal cardiovascular exam     Neuro/Psych  Headaches  Neuromuscular disease (low back pain)  negative psych ROS   GI/Hepatic Neg liver ROS,GERD  ,,symptomatic cholelithiasis   Endo/Other  negative endocrine ROS    Renal/GU      Musculoskeletal   Abdominal  (+) + obese  Peds  Hematology negative hematology ROS (+)   Anesthesia Other Findings Past Medical History: 01/2021: Acute deep vein thrombosis (DVT) of left lower extremity  (HCC) No date: Chronic tension type headache No date: GERD (gastroesophageal reflux disease) No date: Lumbar spondylosis No date: Migraine No date: Migraines No date: Myofascial pain syndrome, cervical No date: Normocytic anemia No date: Ovarian cyst No date: Symptomatic cholelithiasis  Past Surgical History: 10/27/2014: LAPAROSCOPIC OVARIAN CYSTECTOMY; Right     Comment:  Procedure: LAPAROSCOPIC OVARIAN CYSTECTOMY, peritoneal               biopsy;  Surgeon: Ala Dach, MD;  Location: [facility]               ORS;  Service: Gynecology;  Laterality: Right;  BMI    Body Mass Index: 34.01 kg/m      Reproductive/Obstetrics negative OB ROS                             Anesthesia Physical Anesthesia Plan  ASA: 2  Anesthesia Plan: General ETT   Post-op Pain Management: Tylenol PO (pre-op)*, Gabapentin PO (pre-op)* and Toradol IV (intra-op)*   Induction: Intravenous  PONV Risk Score and Plan: 2 and Ondansetron, Dexamethasone and Midazolam  Airway Management Planned: Oral  ETT  Additional Equipment:   Intra-op Plan:   Post-operative Plan: Extubation in OR  Informed Consent: I have reviewed the patients History and Physical, chart, labs and discussed the procedure including the risks, benefits and alternatives for the proposed anesthesia with the patient or authorized representative who has indicated his/her understanding and acceptance.     Dental Advisory Given  Plan Discussed with: CRNA and Surgeon  Anesthesia Plan Comments:         Anesthesia Quick Evaluation

## 2022-05-29 NOTE — Transfer of Care (Signed)
Immediate Anesthesia Transfer of Care Note  Patient: Erin Ramos  Procedure(s) Performed: XI ROBOTIC ASSISTED LAPAROSCOPIC CHOLECYSTECTOMY INDOCYANINE GREEN FLUORESCENCE IMAGING (ICG)  Patient Location: PACU  Anesthesia Type:General  Level of Consciousness: drowsy  Airway & Oxygen Therapy: Patient connected to face mask oxygen  Post-op Assessment: Report given to RN  Post vital signs: Reviewed and stable  Last Vitals:  Vitals Value Taken Time  BP 129/82 05/29/22 0912  Temp    Pulse 94 05/29/22 0914  Resp 19 05/29/22 0914  SpO2 100 % 05/29/22 0914  Vitals shown include unvalidated device data.  Last Pain:  Vitals:   05/29/22 0624  TempSrc: Temporal  PainSc: 4          Complications: No notable events documented.

## 2022-05-29 NOTE — Anesthesia Postprocedure Evaluation (Signed)
Anesthesia Post Note  Patient: TAMETRIA PASCHALL  Procedure(s) Performed: XI ROBOTIC ASSISTED LAPAROSCOPIC CHOLECYSTECTOMY INDOCYANINE GREEN FLUORESCENCE IMAGING (ICG)  Patient location during evaluation: PACU Anesthesia Type: General Level of consciousness: awake and alert Pain management: pain level controlled Vital Signs Assessment: post-procedure vital signs reviewed and stable Respiratory status: spontaneous breathing, nonlabored ventilation and respiratory function stable Cardiovascular status: blood pressure returned to baseline and stable Postop Assessment: no apparent nausea or vomiting Anesthetic complications: no   No notable events documented.   Last Vitals:  Vitals:   05/29/22 1000 05/29/22 1015  BP:  114/72  Pulse:  66  Resp:  16  Temp: (!) 36.3 C (!) 36.4 C  SpO2:  100%    Last Pain:  Vitals:   05/29/22 1015  TempSrc: Temporal  PainSc: 2                  Foye Deer

## 2022-05-29 NOTE — Anesthesia Procedure Notes (Signed)
Procedure Name: Intubation Date/Time: 05/29/2022 7:38 AM  Performed by: Monico Hoar, CRNAPre-anesthesia Checklist: Patient identified, Patient being monitored, Timeout performed, Emergency Drugs available and Suction available Patient Re-evaluated:Patient Re-evaluated prior to induction Oxygen Delivery Method: Circle system utilized Preoxygenation: Pre-oxygenation with 100% oxygen Induction Type: IV induction Ventilation: Mask ventilation without difficulty Laryngoscope Size: Mac and 4 Grade View: Grade I Tube type: Oral Tube size: 7.0 mm Number of attempts: 1 Airway Equipment and Method: Stylet Placement Confirmation: ETT inserted through vocal cords under direct vision, positive ETCO2 and breath sounds checked- equal and bilateral Secured at: 21 cm Tube secured with: Tape Dental Injury: Teeth and Oropharynx as per pre-operative assessment

## 2022-05-29 NOTE — Discharge Instructions (Addendum)
Discharge Instructions: 1.  Patient may shower, but do not scrub wounds heavily and dab dry only. 2.  Do not submerge wounds in pool/tub until fully healed. 3.  Do not apply ointments or hydrogen peroxide to the wounds. 4.  May apply ice packs to the wounds for comfort. 5.  Do not drive while taking narcotics for pain control.  Prior to driving, make sure you are able to rotate right and left to look at blindspots without significant pain or discomfort. 6.  No heavy lifting or pushing of more than 10-15 lbs for 4 weeks.     CIRUGIA AMBULATORIA       Instruccionnes de alta   1.  Las drogas que se Dispensing optician en su cuerpo [employer], asi            que por las proximas 24 horas usted no debe:   Conducir Field seismologist) un automovil   Hacer ninguna decision legal   Tomar ninguna bebida alcoholica  2.  A) [place] puede comenzar una dieta regular.  Es mejor que hoy empiece con           liquidos y gradualmente anada [address]       B) Puede comer cualquier comida que desee pero es mejor empezar con liquidos,                      luego sopitas con galletas saladas y gradualmente llegar a las comidas solidas.  3.  Por favor avise a su medico inmediatamente si usted tiene algun sangrado anormal,       tiene dificultad con la respiracion, enrojecimiento y Engineer, mining en el sitio de la cirugia, [place],       fiebro o dolor que se alivia con [place].  4.  A) Su visita posoperatoria (despues de su operacion) es con el         B)  Por favor llame para hacer la cita posoperatoria.  5.  Istrucciones especificas :

## 2022-05-30 LAB — SURGICAL PATHOLOGY

## 2022-06-12 ENCOUNTER — Encounter: Payer: Self-pay | Admitting: Physician Assistant

## 2022-06-12 ENCOUNTER — Ambulatory Visit (INDEPENDENT_AMBULATORY_CARE_PROVIDER_SITE_OTHER): Payer: BC Managed Care – PPO | Admitting: Physician Assistant

## 2022-06-12 VITALS — BP 120/79 | HR 72 | Temp 98.4°F | Ht 61.0 in | Wt 178.2 lb

## 2022-06-12 DIAGNOSIS — K8020 Calculus of gallbladder without cholecystitis without obstruction: Secondary | ICD-10-CM

## 2022-06-12 DIAGNOSIS — Z09 Encounter for follow-up examination after completed treatment for conditions other than malignant neoplasm: Secondary | ICD-10-CM

## 2022-06-12 MED ORDER — OXYCODONE HCL 5 MG PO TABS: 5.0000 mg | ORAL_TABLET | Freq: Four times a day (QID) | ORAL | 0 refills | Status: AC | PRN

## 2022-06-12 MED ORDER — IBUPROFEN 800 MG PO TABS: 800.0000 mg | ORAL_TABLET | Freq: Three times a day (TID) | ORAL | 1 refills | Status: AC | PRN

## 2022-06-12 NOTE — Patient Instructions (Addendum)
Your Ultrasound is scheduled for 06/17/22 9:30 am (arrive by 9:15 am) at [facility]. Nothing to eat or drink after Midnight 06/16/22.  If you have any concerns or questions, please feel free to call our office. See follow up appointment.    GENERAL POST-OPERATIVE PATIENT INSTRUCTIONS   WOUND CARE INSTRUCTIONS:  Keep a dry clean dressing on the wound if there is drainage. The initial bandage may be removed after 24 hours.  Once the wound has quit draining you may leave it open to air.  If clothing rubs against the wound or causes irritation and the wound is not draining you may cover it with a dry dressing during the daytime.  Try to keep the wound dry and avoid ointments on the wound unless directed to do so.  If the wound becomes bright red and painful or starts to drain infected material that is not clear, please contact your physician immediately.  If the wound is mildly pink and has a thick firm ridge underneath it, this is normal, and is referred to as a healing ridge.  This will resolve over the next 4-6 weeks.  BATHING: You may shower if you have been informed of this by your surgeon. However, Please do not submerge in a tub, hot tub, or pool until incisions are completely sealed or have been told by your surgeon that you may do so.  DIET:  You may eat any foods that you can tolerate.  It is a good idea to eat a high fiber diet and take in plenty of fluids to prevent constipation.  If you do become constipated you may want to take a mild laxative or take ducolax tablets on a daily basis until your bowel habits are regular.  Constipation can be very uncomfortable, along with straining, after recent surgery.  ACTIVITY:  You are encouraged to cough and deep breath or use your incentive spirometer if you were given one, every 15-30 minutes when awake.  This will help prevent respiratory complications and low grade fevers post-operatively if you had a general anesthetic.  You may want to hug a  pillow when coughing and sneezing to add additional support to the surgical area, if you had abdominal or chest surgery, which will decrease pain during these times.  You are encouraged to walk and engage in light activity for the next two weeks.  You should not lift more than 20 pounds for 6 weeks total after surgery as it could put you at increased risk for complications.  Twenty pounds is roughly equivalent to a plastic bag of groceries. At that time- Listen to your body when lifting, if you have pain when lifting, stop and then try again in a few days. Soreness after doing exercises or activities of daily living is normal as you get back in to your normal routine.  MEDICATIONS:  Try to take narcotic medications and anti-inflammatory medications, such as tylenol, ibuprofen, naprosyn, etc., with food.  This will minimize stomach upset from the medication.  Should you develop nausea and vomiting from the pain medication, or develop a rash, please discontinue the medication and contact your physician.  You should not drive, make important decisions, or operate machinery when taking narcotic pain medication.  SUNBLOCK Use sun block to incision area over the next year if this area will be exposed to sun. This helps decrease scarring and will allow you avoid a permanent darkened area over your incision.  QUESTIONS:  Please feel free to call our  office if you have any questions, and we will be glad to assist you. [phone]

## 2022-06-12 NOTE — Progress Notes (Signed)
[facility] POST-OP OFFICE VISIT  06/12/2022  HPI: Erin Ramos is a 42 y.o. female 14 days s/p robotic assisted laparoscopic cholecystectomy for symptomatic cholelithiasis with Dr Erin Ramos   She reports she has had relatively constant RUQ and right flank pain since surgery She will get minimal relief from oxycodone and ibuprofen but the pain returns quickly Nothing seems to make this better or worse She has associated nausea and chills No fever, cough, SOB, CP, emesis, diarrhea, juandice, or urinary complaints She is able to tolerate PO but with decrease in her appetite Incisions are well healed   Vital signs: BP 120/79   Pulse 72   Temp 98.4 F (36.9 C) (Oral)   Ht 5\' 1"  (1.549 m)   Wt 178 lb 3.2 oz (80.8 kg)   LMP 05/06/2022 (Exact Date)   SpO2 98%   BMI 33.67 kg/m    Physical Exam: Constitutional: Well appearing female, NAD Abdomen: Soft, she has mild tenderness to the RUQ but this seems worse over her lower ribs and flank rather than her abdomen itself, non-distended, no rebound/guarding Skin: Laparoscopic incisions are healing well, no erythema or drainage   Assessment/Plan: This is a 42 y.o. female 14 days s/p robotic assisted laparoscopic cholecystectomy for symptomatic cholelithiasis with Dr Erin Ramos    - I do suspect she is having typical post-operative pain from cholecystectomy; however, she wishes to pursue imaging for reassurance, which is reasonable. I will start with RUQ [place] to ensure no missed/residual fluid collections.   - Pain control: Continue OTC modalities, will refill ibuprofen and a short course of oxycodone  - Reviewed wound care recommendation  - Reviewed lifting restrictions; 4 weeks total  - Reviewed surgical pathology; CCC  - We will plan to see her again in 2-3 weeks for recheck; We ill call with [place] results; She understands to call with questions/concerns in the interim  Please note, patient requested daughter to act as  Spanish-English medical interpretor  -- Erin Oxford, PA-C [facility] 06/12/2022, 3:38 PM M-F: 7am - 4pm

## 2022-06-17 ENCOUNTER — Ambulatory Visit: Payer: BC Managed Care – PPO

## 2022-06-24 ENCOUNTER — Encounter: Payer: Self-pay | Admitting: Physician Assistant

## 2022-06-24 ENCOUNTER — Ambulatory Visit (INDEPENDENT_AMBULATORY_CARE_PROVIDER_SITE_OTHER): Payer: BC Managed Care – PPO | Admitting: Physician Assistant

## 2022-06-24 VITALS — BP 113/73 | HR 79 | Temp 98.3°F | Ht 61.0 in | Wt 177.6 lb

## 2022-06-24 DIAGNOSIS — K8020 Calculus of gallbladder without cholecystitis without obstruction: Secondary | ICD-10-CM

## 2022-06-24 DIAGNOSIS — Z09 Encounter for follow-up examination after completed treatment for conditions other than malignant neoplasm: Secondary | ICD-10-CM

## 2022-06-24 MED ORDER — CYCLOBENZAPRINE HCL 5 MG PO TABS: 5.0000 mg | ORAL_TABLET | Freq: Every evening | ORAL | 0 refills | Status: AC | PRN

## 2022-06-24 MED ORDER — LOPERAMIDE HCL 2 MG PO CAPS: 2.0000 mg | ORAL_CAPSULE | Freq: Two times a day (BID) | ORAL | 0 refills | Status: AC

## 2022-06-24 MED ORDER — SIMETHICONE 80 MG PO CHEW: 80.0000 mg | CHEWABLE_TABLET | Freq: Four times a day (QID) | ORAL | 0 refills | Status: AC | PRN

## 2022-06-24 NOTE — Progress Notes (Signed)
[facility] POST-OP OFFICE VISIT  06/24/2022  HPI: Erin Ramos is a 42 y.o. female 26 days s/p robotic assisted laparoscopic cholecystectomy for symptomatic cholelithiasis with Dr Aleen Campi   She reports that her previous RUQ and flank pain is improved; did not get [place] She is having central upper abdominal crampy and gas pains; difficult to distinguish (patient did not want interpretor) No fever, chills, nausea, emesis She is having diarrhea; not using anti-diarrheal Incisions are well healed   Vital signs: BP 113/73   Pulse 79   Temp 98.3 F (36.8 C) (Oral)   Ht 5\' 1"  (1.549 m)   Wt 177 lb 9.6 oz (80.6 kg)   LMP 05/06/2022 (Exact Date)   SpO2 99%   BMI 33.56 kg/m    Physical Exam: Constitutional: Well appearing female, NAD Abdomen: Soft, she seems to have some degree of tenderness to epigastrium, no significant incisional tenderness, no longer with RUQ or right flank pain, non-distended, no rebound/guarding Skin: Laparoscopic incisions are healing well, no erythema or drainage   Assessment/Plan: This is a 42 y.o. female 26 days s/p robotic assisted laparoscopic cholecystectomy for symptomatic cholelithiasis with Dr Aleen Campi    - Pain control prn   - Continue OTC medications +/- ice packs   - Will trial small prescription of flexeril QHS PRN for spasm (10 pills)   - I do not think she needs any narcotics  - Sent Rx for imodium for diarrhea and Simethicone for "gas pains"  - Reviewed wound care recommendation  - Reviewed lifting restrictions; 4 weeks total (completes this is 2 days  - For now, she can follow up as needed. If symptoms fail to improve in the next two weeks, she was encouraged to call with questions/concerns  - I do no think she needs to complete US, my suspicion for complication is low.   -- Lynden Oxford, PA-C [facility] 06/24/2022, 3:34 PM M-F: 7am - 4pm

## 2022-06-24 NOTE — Patient Instructions (Addendum)
Prescriptions:  -- Imodium; This can be taken 2-3 times a day to help with diarrhea. Remember to limit fatty food intake as this can worsen diarrhea --Simethicone (Gas-X); This medication can help with gas pain; can take every 4 hours as needed -- Flexeril: This Medication helps the muscles relax. This can be taken at nighttime as needed. This can make you very sleepy, try to limit use during the day time.   If pain and symptoms do not improve in 2 weeks, call the office back [phone]   GENERAL POST-OPERATIVE PATIENT INSTRUCTIONS   WOUND CARE INSTRUCTIONS:  Keep a dry clean dressing on the wound if there is drainage. The initial bandage may be removed after 24 hours.  Once the wound has quit draining you may leave it open to air.  If clothing rubs against the wound or causes irritation and the wound is not draining you may cover it with a dry dressing during the daytime.  Try to keep the wound dry and avoid ointments on the wound unless directed to do so.  If the wound becomes bright red and painful or starts to drain infected material that is not clear, please contact your physician immediately.  If the wound is mildly pink and has a thick firm ridge underneath it, this is normal, and is referred to as a healing ridge.  This will resolve over the next 4-6 weeks.  BATHING: You may shower if you have been informed of this by your surgeon. However, Please do not submerge in a tub, hot tub, or pool until incisions are completely sealed or have been told by your surgeon that you may do so.  DIET:  You may eat any foods that you can tolerate.  It is a good idea to eat a high fiber diet and take in plenty of fluids to prevent constipation.  If you do become constipated you may want to take a mild laxative or take ducolax tablets on a daily basis until your bowel habits are regular.  Constipation can be very uncomfortable, along with straining, after recent surgery.  ACTIVITY:  You are encouraged to  cough and deep breath or use your incentive spirometer if you were given one, every 15-30 minutes when awake.  This will help prevent respiratory complications and low grade fevers post-operatively if you had a general anesthetic.  You may want to hug a pillow when coughing and sneezing to add additional support to the surgical area, if you had abdominal or chest surgery, which will decrease pain during these times.  You are encouraged to walk and engage in light activity for the next two weeks.  You should not lift more than 20 pounds for 6 weeks total after surgery as it could put you at increased risk for complications.  Twenty pounds is roughly equivalent to a plastic bag of groceries. At that time- Listen to your body when lifting, if you have pain when lifting, stop and then try again in a few days. Soreness after doing exercises or activities of daily living is normal as you get back in to your normal routine.  MEDICATIONS:  Try to take narcotic medications and anti-inflammatory medications, such as tylenol, ibuprofen, naprosyn, etc., with food.  This will minimize stomach upset from the medication.  Should you develop nausea and vomiting from the pain medication, or develop a rash, please discontinue the medication and contact your physician.  You should not drive, make important decisions, or operate machinery when taking narcotic pain  medication.  SUNBLOCK Use sun block to incision area over the next year if this area will be exposed to sun. This helps decrease scarring and will allow you avoid a permanent darkened area over your incision.  QUESTIONS:  Please feel free to call our office if you have any questions, and we will be glad to assist you. [phone]

## 2022-08-01 ENCOUNTER — Other Ambulatory Visit: Payer: Self-pay | Admitting: Family Medicine

## 2022-08-01 DIAGNOSIS — Z1231 Encounter for screening mammogram for malignant neoplasm of breast: Secondary | ICD-10-CM

## 2022-08-14 ENCOUNTER — Ambulatory Visit
Admission: RE | Admit: 2022-08-14 | Discharge: 2022-08-14 | Disposition: A | Discharge status: Home / Self Care | Payer: BC Managed Care – PPO | Source: Ambulatory Visit | Attending: Family Medicine | Admitting: Family Medicine

## 2022-08-14 DIAGNOSIS — Z1231 Encounter for screening mammogram for malignant neoplasm of breast: Secondary | ICD-10-CM | POA: Diagnosis present

## 2022-08-28 IMAGING — US US EXTREM LOW VENOUS*L*
1 series · 15 of 24 positions shown · non-contrast
Comparison: Prior left lower extremity venous ultrasound 01/29/2021

CLINICAL DATA: Left lower extremity DVT follow-up

EXAM:
LEFT LOWER EXTREMITY VENOUS DOPPLER ULTRASOUND
TECHNIQUE: Gray-scale sonography with compression, as well as color and duplex
ultrasound, were performed to evaluate the deep venous system(s)
from the level of the common femoral vein through the popliteal and
proximal calf veins.

[Series 1: us venous imag bi/left/(id) · portal-venous · 15 of 49 slices shown]
[im 1/49]
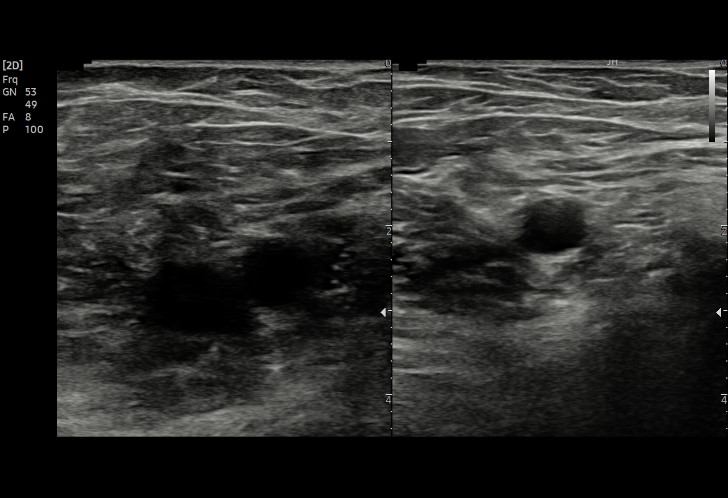
[im 5/49]
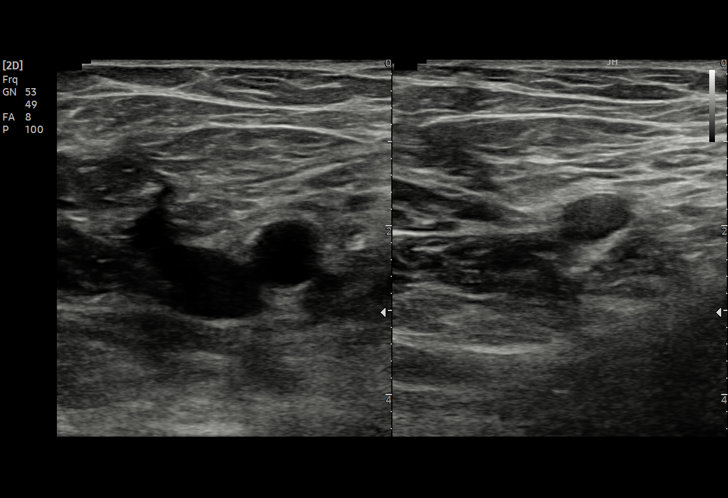
[im 9/49]
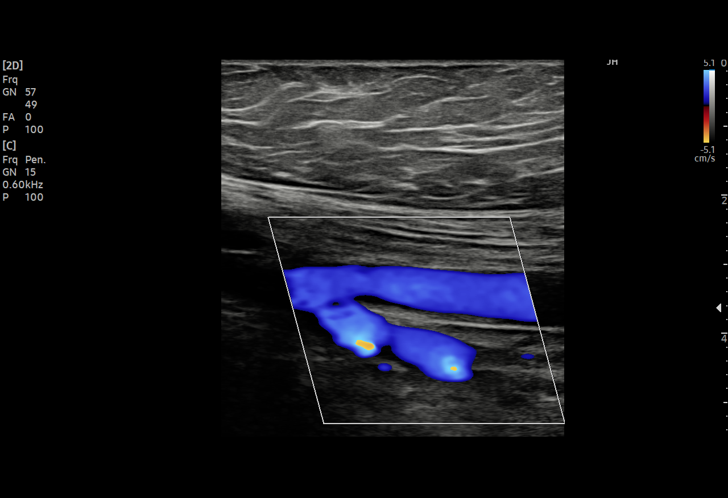
[im 11/49]
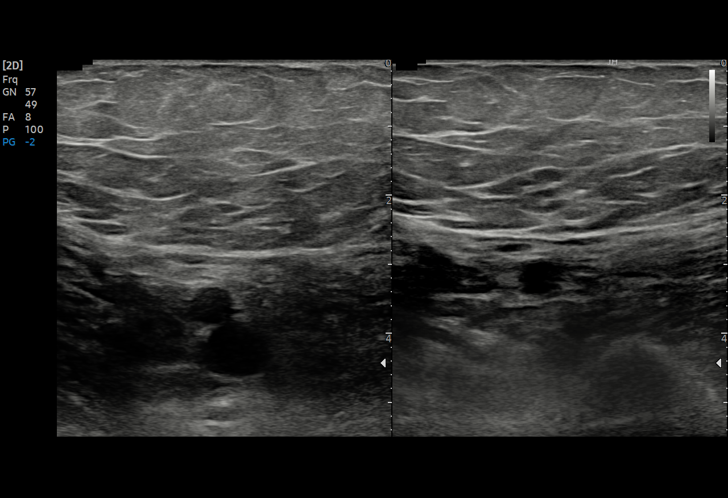
[im 15/49]
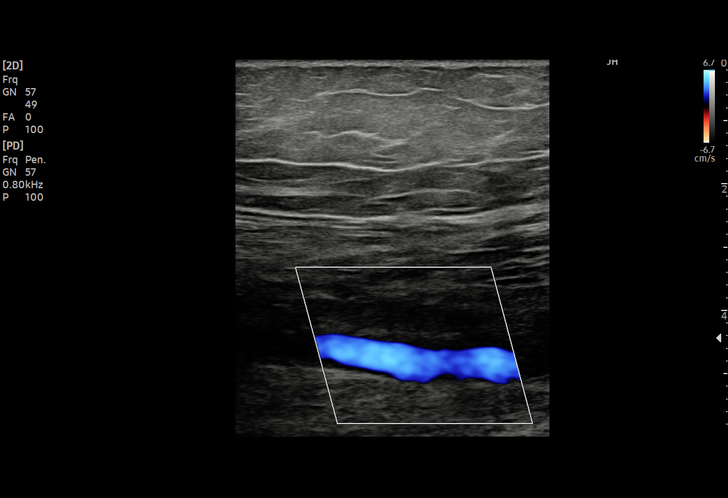
[im 17/49]
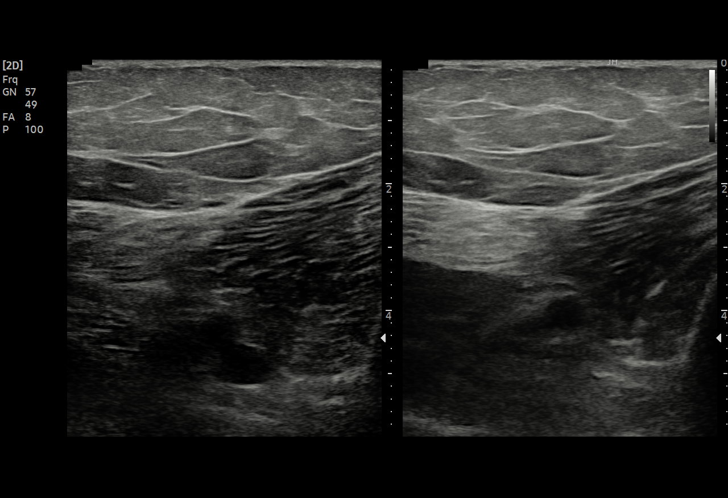
[im 21/49]
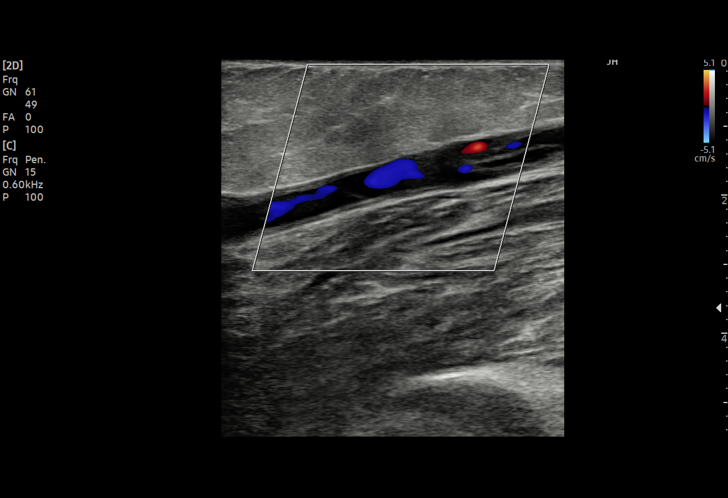
[im 26/49]
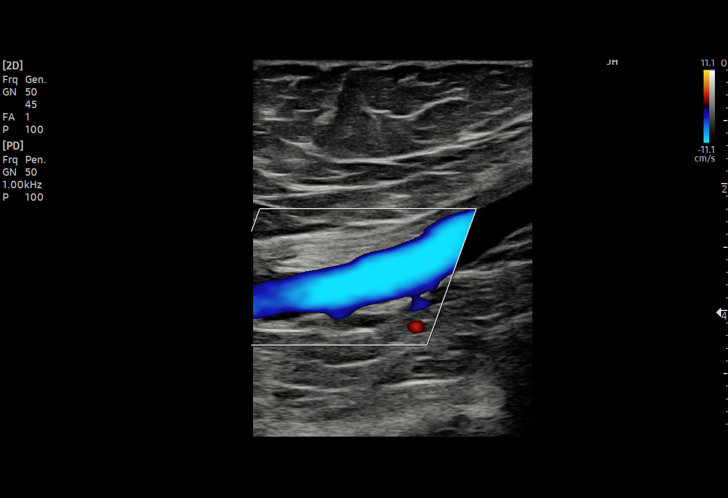
[im 28/49]
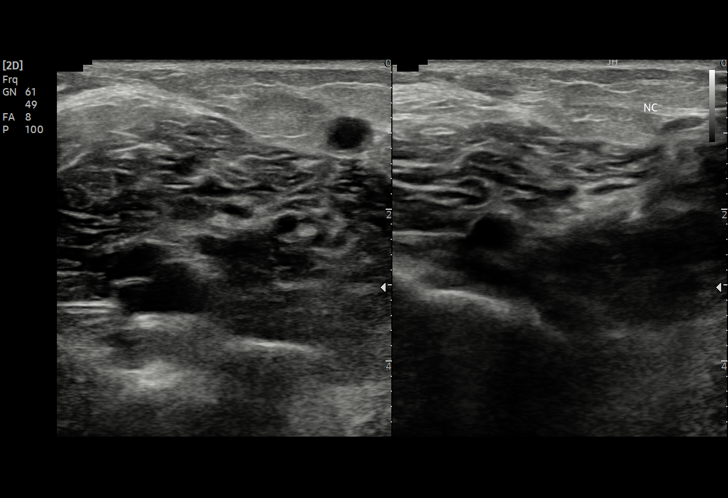
[im 32/49]
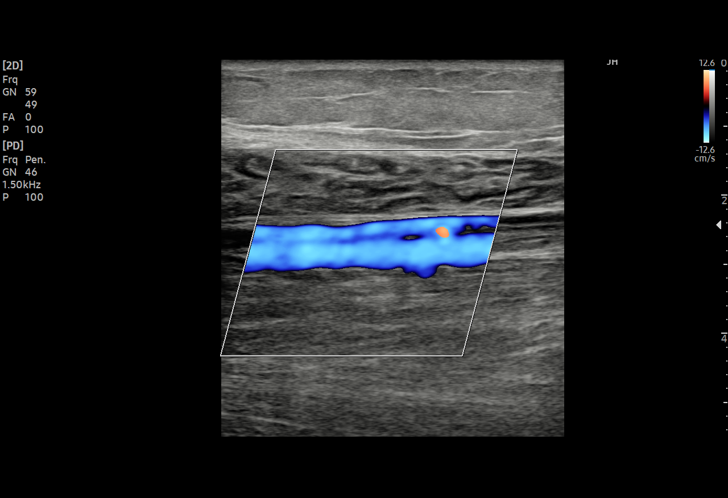
[im 34/49]
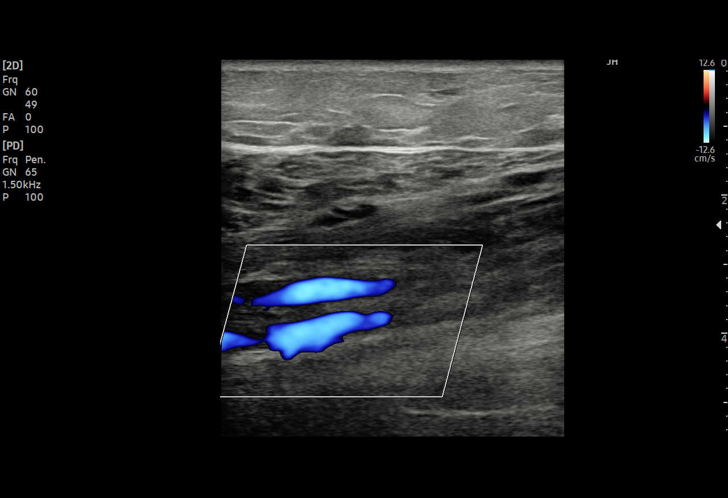
[im 38/49]
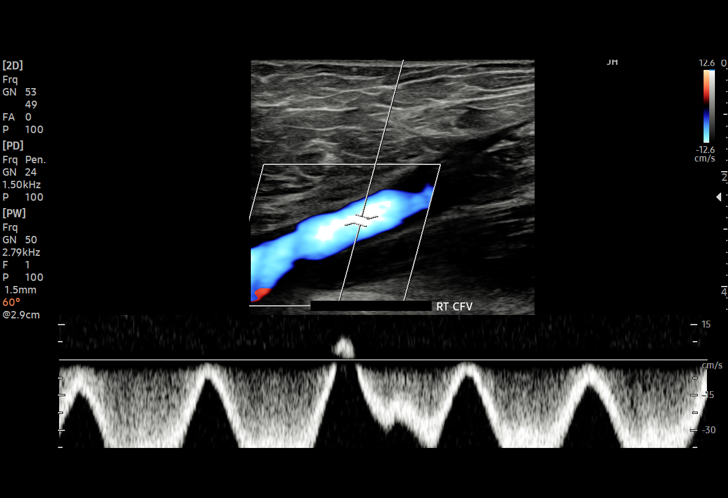
[im 42/49]
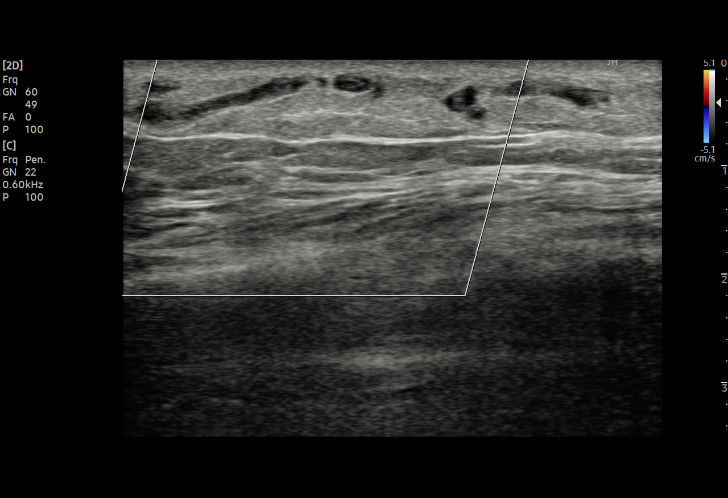
[im 44/49]
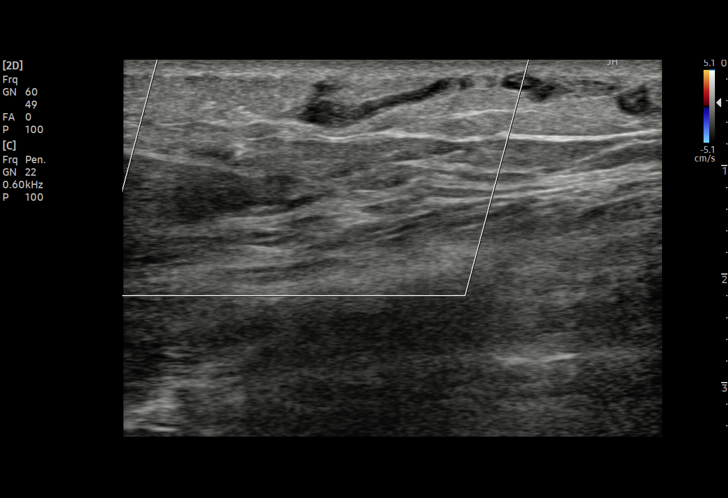
[im 49/49]
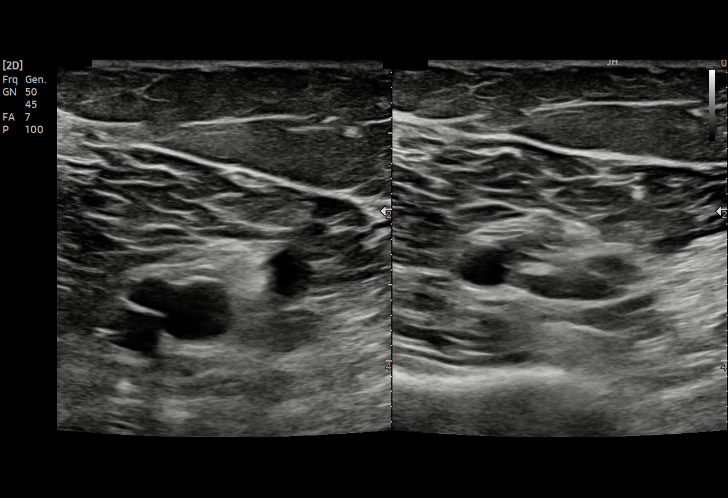

[15 of 24 positions shown; findings below may reference images not displayed]

FINDINGS: VENOUS

Normal compressibility of the common femoral, superficial femoral,
and popliteal veins, as well as the visualized calf veins.
Visualized portions of profunda femoral vein and great saphenous
vein unremarkable. No filling defects to suggest DVT on grayscale or
color Doppler imaging. Doppler waveforms show normal direction of
venous flow, normal respiratory plasticity and response to
augmentation.

Limited views of the contralateral common femoral vein are
unremarkable.

OTHER

Partially recanalized small saphenous vein and associated
superficial varicosities in the proximal calf.

Limitations: none
IMPRESSION: 1. Partially recanalized superficial thrombus from within the small
saphenous vein and associated superficial venous varicosities in the
proximal calf. This represents improvement compared to 01/29/2021.
2. No evidence of deep venous thrombosis.

## 2022-11-25 ENCOUNTER — Ambulatory Visit: Payer: BC Managed Care – PPO

## 2023-07-21 ENCOUNTER — Other Ambulatory Visit: Payer: Self-pay | Admitting: Family Medicine

## 2023-07-21 DIAGNOSIS — Z1231 Encounter for screening mammogram for malignant neoplasm of breast: Secondary | ICD-10-CM

## 2023-08-19 ENCOUNTER — Ambulatory Visit
Admission: RE | Admit: 2023-08-19 | Discharge: 2023-08-19 | Disposition: A | Discharge status: Home / Self Care | Payer: Self-pay | Source: Ambulatory Visit | Attending: Family Medicine | Admitting: Family Medicine

## 2023-08-19 DIAGNOSIS — Z1231 Encounter for screening mammogram for malignant neoplasm of breast: Secondary | ICD-10-CM | POA: Insufficient documentation

## 2023-12-04 ENCOUNTER — Encounter
Admission: RE | Disposition: A | Discharge status: Home / Self Care | Payer: Self-pay | Source: Home / Self Care | Attending: Gastroenterology

## 2023-12-04 ENCOUNTER — Ambulatory Visit: Admitting: Anesthesiology

## 2023-12-04 ENCOUNTER — Encounter: Payer: Self-pay | Admitting: Gastroenterology

## 2023-12-04 ENCOUNTER — Ambulatory Visit
Admission: RE | Admit: 2023-12-04 | Discharge: 2023-12-04 | Disposition: A | Discharge status: Home / Self Care | Attending: Gastroenterology | Admitting: Gastroenterology

## 2023-12-04 DIAGNOSIS — K219 Gastro-esophageal reflux disease without esophagitis: Secondary | ICD-10-CM | POA: Diagnosis not present

## 2023-12-04 DIAGNOSIS — Z1211 Encounter for screening for malignant neoplasm of colon: Secondary | ICD-10-CM | POA: Insufficient documentation

## 2023-12-04 HISTORY — PX: COLONOSCOPY: SHX5424

## 2023-12-04 LAB — POCT PREGNANCY, URINE: Preg Test, Ur: NEGATIVE

## 2023-12-04 SURGERY — COLONOSCOPY
Anesthesia: General

## 2023-12-04 MED ORDER — PHENYLEPHRINE 80 MCG/ML (10ML) SYRINGE FOR IV PUSH (FOR BLOOD PRESSURE SUPPORT)
PREFILLED_SYRINGE | INTRAVENOUS | Status: DC | PRN
  Administered 2023-12-04: 160 ug via INTRAVENOUS

## 2023-12-04 MED ORDER — SODIUM CHLORIDE 0.9 % IV SOLN
INTRAVENOUS | Status: DC
  Administered 2023-12-04: 20 mL/h via INTRAVENOUS

## 2023-12-04 MED ORDER — PROPOFOL 10 MG/ML IV BOLUS
INTRAVENOUS | Status: DC | PRN
  Administered 2023-12-04: 100 mg via INTRAVENOUS
  Administered 2023-12-04: 150 ug/kg/min via INTRAVENOUS

## 2023-12-04 MED ORDER — LIDOCAINE HCL (CARDIAC) PF 100 MG/5ML IV SOSY
PREFILLED_SYRINGE | INTRAVENOUS | Status: DC | PRN
  Administered 2023-12-04: 50 mg via INTRAVENOUS

## 2023-12-04 NOTE — Anesthesia Procedure Notes (Signed)
 Date/Time: 12/04/2023 9:47 AM  Performed by: Dominica Krabbe, CRNAPre-anesthesia Checklist: Patient identified, Emergency Drugs available, Suction available, Patient being monitored and Timeout performed Patient Re-evaluated:Patient Re-evaluated prior to induction Oxygen Delivery Method: Nasal cannula Preoxygenation: Pre-oxygenation with 100% oxygen Induction Type: IV induction

## 2023-12-04 NOTE — Transfer of Care (Signed)
 Immediate Anesthesia Transfer of Care Note  Patient: Erin Ramos  Procedure(s) Performed: COLONOSCOPY  Patient Location: Endoscopy Unit  Anesthesia Type:General  Level of Consciousness: awake, alert , and oriented  Airway & Oxygen Therapy: Patient Spontanous Breathing  Post-op Assessment: Report given to RN and Post -op Vital signs reviewed and stable  Post vital signs: Reviewed and stable  Last Vitals:  Vitals Value Taken Time  BP 113/70 12/04/23 10:05  Temp 35.8 C 12/04/23 10:05  Pulse 65 12/04/23 10:06  Resp 20 12/04/23 10:06  SpO2 99 % 12/04/23 10:06  Vitals shown include unfiled device data.  Last Pain:  Vitals:   12/04/23 1005  TempSrc: Temporal  PainSc: 0-No pain         Complications: No notable events documented.

## 2023-12-04 NOTE — H&P (Signed)
 Ruel Kung , MD [address], [place], [place], [address] Phone: [phone] Fax: [phone]  Primary Care Physician:  Tobie Domino, MD   Pre-Procedure History & Physical: HPI:  Erin Ramos is a 43 y.o. female is here for an colonoscopy.   Past Medical History:  Diagnosis Date   Acute deep vein thrombosis (DVT) of left lower extremity (HCC) 01/2021   Chronic tension type headache    GERD (gastroesophageal reflux disease)    Lumbar spondylosis    Migraine    Migraines    Myofascial pain syndrome, cervical    Normocytic anemia    Ovarian cyst    Symptomatic cholelithiasis     Past Surgical History:  Procedure Laterality Date   LAPAROSCOPIC OVARIAN CYSTECTOMY Right 10/27/2014   Procedure: LAPAROSCOPIC OVARIAN CYSTECTOMY, peritoneal biopsy;  Surgeon: Orvil MARLA Arenas, MD;  Location: [facility] ORS;  Service: Gynecology;  Laterality: Right;    Prior to Admission medications   Medication Sig Start Date End Date Taking? Authorizing Provider  acetaminophen  (TYLENOL ) 500 MG tablet Take 2 tablets (1,000 mg total) by mouth every 6 (six) hours as needed for mild pain. 05/29/22  Yes Piscoya, Aloysius, MD  baclofen (LIORESAL) 10 MG tablet Take 10 mg by mouth 3 (three) times daily. 1 Tablet(s) By Mouth 3 Times Daily 11/29/20  Yes [provider]  cyclobenzaprine  (FLEXERIL ) 5 MG tablet Take 1 tablet (5 mg total) by mouth at bedtime as needed for muscle spasms. 06/24/22  Yes Schulz, Zachary R, PA-C  fexofenadine (ALLEGRA) 180 MG tablet Take 180 mg by mouth daily. Take 180 mg by mouth daily   Yes [provider]  ibuprofen  (ADVIL ) 800 MG tablet Take 1 tablet (800 mg total) by mouth every 8 (eight) hours as needed for moderate pain. 06/12/22  Yes Schulz, Zachary R, PA-C  loperamide  (IMODIUM  A-D) 2 MG capsule Take 1 capsule (2 mg total) by mouth 2  (two) times daily. 06/24/22  Yes Schulz, Zachary R, PA-C  oxyCODONE  (OXY IR/ROXICODONE ) 5 MG immediate release tablet Take 1 tablet (5 mg total) by mouth every 6 (six) hours as needed for breakthrough pain or severe pain. 06/12/22  Yes Schulz, Zachary R, PA-C  rizatriptan (MAXALT) 10 MG tablet Take 1 tablet by mouth at ONSET of migraine or aura. May repeat in 2 HOURS if needed. DO NOT EXCEED 2 doses/day, 2 days/week, 8 doses/month. 03/26/20  Yes [provider]  simethicone  (GAS-X) 80 MG chewable tablet Chew 1 tablet (80 mg total) by mouth every 6 (six) hours as needed for flatulence. 06/24/22  Yes Schulz, Zachary R, PA-C  omeprazole  (PRILOSEC) 40 MG capsule Take 1 capsule (40 mg total) by mouth 2 (two) times daily for 15 days. 08/06/17 06/24/22  Kung Ruel, MD    Allergies as of 11/18/2023 - Review Complete 06/24/2022  Allergen Reaction Noted   Shellfish allergy Anaphylaxis and Other (See Comments) 10/16/2014    Family History  Problem Relation Age of Onset   Diabetes Mother    Clotting disorder Maternal Aunt     Social History   Socioeconomic History   Marital status: Married    Spouse name: Not on file   Number of children: Not on file   Years of education: Not on file   Highest education level: Not on file  Occupational History   Not on file  Tobacco Use   Smoking status: Never    Passive exposure: Never   Smokeless tobacco: Never  Vaping Use  Vaping status: Never Used  Substance and Sexual Activity   Alcohol use: No   Drug use: No   Sexual activity: Not on file  Other Topics Concern   Not on file  Social History Narrative   Not on file   Social Drivers of Health   Financial Resource Strain: Not on file  Food Insecurity: Not on file  Transportation Needs: Not on file  Physical Activity: Not on file  Stress: Not on file  Social Connections: Not on file  Intimate Partner Violence: Not on file    Review of Systems: See HPI, otherwise negative  ROS  Physical Exam: BP 106/76   Pulse 70   Temp (!) 96.3 F (35.7 C)   Resp 20   Ht 5' 1 (1.549 m)   Wt 75.5 kg   SpO2 100%   BMI 31.44 kg/m  General:   Alert,  pleasant and cooperative in NAD Head:  Normocephalic and atraumatic. Neck:  Supple; no masses or thyromegaly. Lungs:  Clear throughout to auscultation, normal respiratory effort.    Heart:  +S1, +S2, Regular rate and rhythm, No edema. Abdomen:  Soft, nontender and nondistended. Normal bowel sounds, without guarding, and without rebound.   Neurologic:  Alert and  oriented x4;  grossly normal neurologically.  Impression/Plan: Erin Ramos is here for an colonoscopy to be performed for Screening colonoscopy average risk   Risks, benefits, limitations, and alternatives regarding  colonoscopy have been reviewed with the patient.  Questions have been answered.  All parties agreeable.   Ruel Kung, MD  12/04/2023, 8:52 AM

## 2023-12-04 NOTE — Anesthesia Postprocedure Evaluation (Signed)
 Anesthesia Post Note  Patient: Erin Ramos  Procedure(s) Performed: COLONOSCOPY  Patient location during evaluation: Endoscopy Anesthesia Type: General Level of consciousness: awake and alert Pain management: pain level controlled Vital Signs Assessment: post-procedure vital signs reviewed and stable Respiratory status: spontaneous breathing, nonlabored ventilation, respiratory function stable and patient connected to nasal cannula oxygen Cardiovascular status: blood pressure returned to baseline and stable Postop Assessment: no apparent nausea or vomiting Anesthetic complications: no   No notable events documented.   Last Vitals:  Vitals:   12/04/23 1005 12/04/23 1015  BP: 113/70 101/68  Pulse: 62 65  Resp: 19 16  Temp: (!) 35.8 C   SpO2: 99% 100%    Last Pain:  Vitals:   12/04/23 1005  TempSrc: Temporal  PainSc: 0-No pain                 Prentice Murphy

## 2023-12-04 NOTE — Op Note (Signed)
 [facility] Patient Name: Erin Ramos Procedure Date: 12/04/2023 9:46 AM MRN: [id number] Account #: [id number] Date of Birth: 02-03-1980 Admit Type: Outpatient Age: 43 Room: [facility] ENDO ROOM 4 Gender: Female Note Status: Finalized Instrument Name: Colon Scope [id number] Procedure:             Colonoscopy Indications:           Screening for colorectal malignant neoplasm Providers:             Ruel Kung MD, MD Referring MD:          Ruel Kung MD, MD (Referring MD), Lauraine Blanch MD, MD                         (Referring MD) Medicines:             Monitored Anesthesia Care Complications:         No immediate complications. Procedure:             Pre-Anesthesia Assessment:                        - Prior to the procedure, a History and Physical was                         performed, and patient medications, allergies and                         sensitivities were reviewed. The patient's tolerance                         of previous anesthesia was reviewed.                        - The risks and benefits of the procedure and the                         sedation options and risks were discussed with the                         patient. All questions were answered and informed                         consent was obtained.                        - ASA Grade Assessment: II - A patient with mild                         systemic disease.                        After obtaining informed consent, the colonoscope was                         passed under direct vision. Throughout the procedure,                         the patient's blood pressure, pulse, and oxygen  saturations were monitored continuously. The                         Colonoscope was introduced through the anus and                         advanced to the the cecum, identified by the                         appendiceal orifice. The colonoscopy was performed                          with ease. The patient tolerated the procedure well.                         The quality of the bowel preparation was excellent.                         The ileocecal valve, appendiceal orifice, and rectum                         were photographed. Findings:      The perianal and digital rectal examinations were normal.      The entire examined colon appeared normal on direct and retroflexion       views. Impression:            - The entire examined colon is normal on direct and                         retroflexion views.                        - No specimens collected. Recommendation:        - Discharge patient to home (with escort).                        - Resume previous diet.                        - Continue present medications.                        - Repeat colonoscopy in 10 years for screening                         purposes. Procedure Code(s):     --- Professional ---                        [phone], Colonoscopy, flexible; diagnostic, including                         collection of specimen(s) by brushing or washing, when                         performed (separate procedure) Diagnosis Code(s):     --- Professional ---                        Z12.11, Encounter for screening for malignant neoplasm  of colon CPT copyright 2022 [employer]. All rights reserved. The codes documented in this report are preliminary and upon coder review may  be revised to meet current compliance requirements. Ruel Kung, MD Ruel Kung MD, MD 12/04/2023 10:00:52 AM This report has been signed electronically. Number of Addenda: 0 Note Initiated On: 12/04/2023 9:46 AM Scope Withdrawal Time: 0 hours 6 minutes 46 seconds  Total Procedure Duration: 0 hours 9 minutes 7 seconds  Estimated Blood Loss:  Estimated blood loss: none.      [facility]

## 2023-12-04 NOTE — Anesthesia Preprocedure Evaluation (Signed)
 Anesthesia Evaluation  Patient identified by MRN, date of birth, ID band Patient awake    Reviewed: Allergy & Precautions, H&P , NPO status , Patient's Chart, lab work & pertinent test results  History of Anesthesia Complications Negative for: history of anesthetic complications  Airway Mallampati: I  TM Distance: >3 FB Neck ROM: full    Dental  (+) Dental Advidsory Given, Chipped, Teeth Intact   Pulmonary neg pulmonary ROS   Pulmonary exam normal        Cardiovascular negative cardio ROS Normal cardiovascular exam     Neuro/Psych  Headaches, neg Seizures  Neuromuscular disease (low back pain)  negative psych ROS   GI/Hepatic Neg liver ROS,GERD  ,,symptomatic cholelithiasis   Endo/Other  negative endocrine ROS    Renal/GU negative Renal ROS     Musculoskeletal   Abdominal  (+) + obese  Peds  Hematology negative hematology ROS (+)   Anesthesia Other Findings Past Medical History: 01/2021: Acute deep vein thrombosis (DVT) of left lower extremity  (HCC) No date: Chronic tension type headache No date: GERD (gastroesophageal reflux disease) No date: Lumbar spondylosis No date: Migraine No date: Migraines No date: Myofascial pain syndrome, cervical No date: Normocytic anemia No date: Ovarian cyst No date: Symptomatic cholelithiasis  Past Surgical History: 10/27/2014: LAPAROSCOPIC OVARIAN CYSTECTOMY; Right     Comment:  Procedure: LAPAROSCOPIC OVARIAN CYSTECTOMY, peritoneal               biopsy;  Surgeon: Orvil MARLA Arenas, MD;  Location: [facility]               ORS;  Service: Gynecology;  Laterality: Right;  BMI    Body Mass Index: 34.01 kg/m      Reproductive/Obstetrics negative OB ROS                              Anesthesia Physical Anesthesia Plan  ASA: 2  Anesthesia Plan: General   Post-op Pain Management: Minimal or no pain anticipated   Induction: Intravenous  PONV  Risk Score and Plan: 3 and Propofol  infusion and TIVA  Airway Management Planned: Natural Airway and Nasal Cannula  Additional Equipment:   Intra-op Plan:   Post-operative Plan:   Informed Consent: I have reviewed the patients History and Physical, chart, labs and discussed the procedure including the risks, benefits and alternatives for the proposed anesthesia with the patient or authorized representative who has indicated his/her understanding and acceptance.     Dental Advisory Given  Plan Discussed with: CRNA and Surgeon  Anesthesia Plan Comments:          Anesthesia Quick Evaluation
